# Patient Record
Sex: Female | Born: 1990 | Race: White | Hispanic: No | Marital: Single | Smoking: Current every day smoker
Health system: Southern US, Community
[De-identification: ages and names within clinical notes are randomized; demographics above are authoritative.]

## PROBLEM LIST (undated history)

## (undated) DIAGNOSIS — E119 Type 2 diabetes mellitus without complications: Secondary | ICD-10-CM

## (undated) DIAGNOSIS — R519 Headache, unspecified: Secondary | ICD-10-CM

## (undated) DIAGNOSIS — I10 Essential (primary) hypertension: Secondary | ICD-10-CM

## (undated) DIAGNOSIS — E282 Polycystic ovarian syndrome: Secondary | ICD-10-CM

## (undated) DIAGNOSIS — L309 Dermatitis, unspecified: Secondary | ICD-10-CM

## (undated) DIAGNOSIS — N6310 Unspecified lump in the right breast, unspecified quadrant: Secondary | ICD-10-CM

## (undated) HISTORY — DX: Unspecified lump in the right breast, unspecified quadrant: N63.10

## (undated) HISTORY — DX: Dermatitis, unspecified: L30.9

## (undated) HISTORY — PX: CHOLECYSTECTOMY: SHX55

---
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
## 2009-10-07 ENCOUNTER — Inpatient Hospital Stay: Payer: Self-pay | Admitting: Obstetrics and Gynecology

## 2010-01-06 ENCOUNTER — Emergency Department: Payer: Self-pay | Admitting: Emergency Medicine

## 2010-10-21 ENCOUNTER — Emergency Department: Payer: Self-pay | Admitting: Emergency Medicine

## 2012-06-13 DIAGNOSIS — N6310 Unspecified lump in the right breast, unspecified quadrant: Secondary | ICD-10-CM

## 2012-06-13 HISTORY — DX: Unspecified lump in the right breast, unspecified quadrant: N63.10

## 2012-06-13 HISTORY — PX: BREAST SURGERY: SHX581

## 2013-03-07 ENCOUNTER — Other Ambulatory Visit: Payer: Managed Care, Other (non HMO)

## 2013-03-07 ENCOUNTER — Ambulatory Visit (INDEPENDENT_AMBULATORY_CARE_PROVIDER_SITE_OTHER): Payer: Managed Care, Other (non HMO) | Admitting: General Surgery

## 2013-03-07 ENCOUNTER — Encounter: Payer: Self-pay | Admitting: General Surgery

## 2013-03-07 VITALS — BP 100/64 | HR 84 | Resp 14 | Ht 65.0 in | Wt 170.0 lb

## 2013-03-07 DIAGNOSIS — N63 Unspecified lump in unspecified breast: Secondary | ICD-10-CM

## 2013-03-07 NOTE — Progress Notes (Signed)
Patient ID: Kim Crawford, female   DOB: March 23, 1991, 22 y.o.   MRN: [id number]  Chief Complaint  Patient presents with  . Breast Problem    HPI Kim Crawford is a 22 y.o. female.  who presents for a breast evaluation referred by Kim Ferrari NP. There is a right breast mass noted about one month ago.  States the area is tender.  Denies nipple discharge.  Denies breast trauma. Has never noticed any symptoms like this in the past.        Patient does perform regular self breast checks.    HPI  Past Medical History  Diagnosis Date  . Eczema     Past Surgical History  Procedure Laterality Date  . Cesarean section  2011    Family History  Problem Relation Age of Onset  . Adopted: Yes    Social History History  Substance Use Topics  . Smoking status: Current Every Day Smoker -- 1.00 packs/day for 7 years    Types: Cigarettes  . Smokeless tobacco: Not on file  . Alcohol Use: Yes     Comment: occasionally    Allergies  Allergen Reactions  . Penicillins Rash    No current outpatient prescriptions on file.   No current facility-administered medications for this visit.    Review of Systems Review of Systems  Constitutional: Negative.   Respiratory: Negative.   Cardiovascular: Negative.     Blood pressure 100/64, pulse 84, resp. rate 14, height 5' 5"  (1.651 m), weight 170 lb (77.111 kg).  Physical Exam Physical Exam  Constitutional: She is oriented to person, place, and time. She appears well-developed and well-nourished.  Eyes: Conjunctivae are normal. No scleral icterus.  Neck: Neck supple. No thyromegaly present.  Cardiovascular: Normal rate, regular rhythm and normal heart sounds.   Pulmonary/Chest: Effort normal and breath sounds normal. Right breast exhibits mass ( 2cm firm, mobile mass located 10 o'cl right ) and tenderness. Right breast exhibits no inverted nipple, no nipple discharge and no skin change. Left breast exhibits no inverted nipple, no mass, no  nipple discharge, no skin change and no tenderness.  Lymphadenopathy:    She has no cervical adenopathy.    She has no axillary adenopathy.  Neurological: She is alert and oriented to person, place, and time.  Skin: Skin is warm and dry.    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        Kim Crawford G 03/08/2013, 5:30 AM

## 2013-03-07 NOTE — Patient Instructions (Addendum)
Continue self breast exams. Call office for any new breast issues or concerns.Breast Biopsy Care After These instructions give you information on caring for yourself after your procedure. Your doctor may also give you more specific instructions. Call your doctor if you have any problems or questions after your procedure. HOME CARE  Only take medicine as told by your doctor.  Do not take aspirin.  Keep your sutures (stitches) dry when bathing.  Protect the biopsy area. Do not let the area get bumped.  Avoid activities that could pull the biopsy site open until your doctor approves. This includes:  Stretching.  Reaching.  Exercise.  Sports.  Lifting more than 3lb.  Continue your normal diet.  Wear a good support bra for as long as told by your doctor.  Change any bandages (dressings) as told by your doctor.  Do not drink alcohol while taking pain medicine.  Keep all doctor visits as told. Ask when your test results will be ready. Make sure you get your test results. GET HELP RIGHT AWAY IF:   You have a fever.  You have more bleeding (more than a small spot) from the biopsy site.  You have trouble breathing.  You have yellowish-white fluid (pus) coming from the biopsy site.  You have redness, puffiness (swelling), or more pain in the biopsy site.  You have a bad smell coming from the biopsy site.  Your biopsy site opens after sutures, staples, or sticky strips have been removed.  You have a rash.  You need stronger medicine. MAKE SURE YOU:  Understand these instructions.  Will watch your condition.  Will get help right away if you are not doing well or get worse. Document Released: 03/26/2009 Document Revised: 08/22/2011 Document Reviewed: 07/10/2011 [facility] Patient Information 2014 [place].

## 2013-03-08 ENCOUNTER — Encounter: Payer: Self-pay | Admitting: General Surgery

## 2013-03-11 ENCOUNTER — Telehealth: Payer: Self-pay | Admitting: General Surgery

## 2013-03-11 LAB — PATHOLOGY

## 2013-03-11 NOTE — Telephone Encounter (Signed)
Path report is consistent with fibroadenoma. No atypia noted. Pt advised on this. She is inclined to have it removed since it does seem to be causing some local discomfort. She states she will think about this and let us know her decision.  If she decides against excision will like to see her in 3-4 mos.

## 2013-03-11 NOTE — Telephone Encounter (Signed)
PATIENT CALLED & WOULD LIKE TO KNOW HER PATH RESULTS FROM 03-07-13.

## 2013-03-12 NOTE — Telephone Encounter (Signed)
The patient has decided to wait and monitor the area for now.  If it changes she will call back sooner. F/U in Dec as scheduled.

## 2013-03-20 ENCOUNTER — Telehealth: Payer: Self-pay | Admitting: General Surgery

## 2013-03-20 NOTE — Telephone Encounter (Signed)
PATIENT CALLED AND IS READY TO SET UP EXCISION.PLEASE INDICATE WHAT EXCISION & TO BE DONE IN [facility] HOSPITAL.

## 2013-03-25 ENCOUNTER — Telehealth: Payer: Self-pay | Admitting: *Deleted

## 2013-03-25 NOTE — Telephone Encounter (Signed)
Patient called the office and states she starts a new job tomorrow. She will come in for a pre-op visit on 03-27-13 at 3:15 pm. This patient will talk to her employer tomorrow and let us know what day next week would be best for surgery.

## 2013-03-25 NOTE — Telephone Encounter (Signed)
Message for patient to call the office.  We need to arrange for her right breast mass excision. This is to be completed at [facility] as an outpatient setting. She will need to come in for a pre-op visit a few days prior to surgery.

## 2013-03-25 NOTE — Telephone Encounter (Signed)
Patient called back to say she would like to have surgery around Thanksgiving. This has been arranged for 05-07-13 at [facility]. Paperwork will be mailed to the patient. She will come in the office for a pre-op visit on 05-02-13 instead of this month as originally planned.

## 2013-03-27 ENCOUNTER — Ambulatory Visit: Payer: Managed Care, Other (non HMO) | Admitting: General Surgery

## 2013-04-17 ENCOUNTER — Other Ambulatory Visit: Payer: Self-pay | Admitting: General Surgery

## 2013-04-17 DIAGNOSIS — D241 Benign neoplasm of right breast: Secondary | ICD-10-CM

## 2013-05-02 ENCOUNTER — Ambulatory Visit (INDEPENDENT_AMBULATORY_CARE_PROVIDER_SITE_OTHER): Payer: Managed Care, Other (non HMO) | Admitting: General Surgery

## 2013-05-02 ENCOUNTER — Encounter: Payer: Self-pay | Admitting: General Surgery

## 2013-05-02 VITALS — BP 140/90 | HR 84 | Resp 14 | Ht 65.0 in | Wt 173.0 lb

## 2013-05-02 DIAGNOSIS — D249 Benign neoplasm of unspecified breast: Secondary | ICD-10-CM

## 2013-05-02 DIAGNOSIS — D241 Benign neoplasm of right breast: Secondary | ICD-10-CM

## 2013-05-02 NOTE — Progress Notes (Signed)
Patient presents for a pre op evaluation for a right breast mass excision. The surgery is scheduled for 05/07/13. The patient denies any new problems at this time.    She has a fibroadenoma in right breast. It has been causing some focal pain which has not resolved. Pt opted to have it removed.  The patient otherwise healthy with no major medical illnesses.  Examination: Neck supple no nodes or masses palpable. Lungs clear to auscultation percussion. Heart sinus rhythm no murmurs appreciated. Breast exam shows a palpable firm mobile mass about 2-2-1/2 cm in the upper  outer portion of right breast. No other palpable abnormality on either side. No lymphadenopathy noted in the neck or the axilla.  Impression: Fibroadenoma right breast symptomatic. Plan: Excision. Procedure risks benefits explained to the patient. She is agreeable.

## 2013-05-02 NOTE — Patient Instructions (Signed)
Patient is scheduled for right breast surgery on 05/07/13.

## 2013-05-03 ENCOUNTER — Encounter: Payer: Self-pay | Admitting: General Surgery

## 2013-05-07 ENCOUNTER — Ambulatory Visit: Payer: Self-pay | Admitting: General Surgery

## 2013-05-07 ENCOUNTER — Encounter: Payer: Self-pay | Admitting: General Surgery

## 2013-05-07 DIAGNOSIS — D249 Benign neoplasm of unspecified breast: Secondary | ICD-10-CM

## 2013-05-08 LAB — PATHOLOGY REPORT

## 2013-05-13 ENCOUNTER — Encounter: Payer: Self-pay | Admitting: General Surgery

## 2013-05-16 ENCOUNTER — Ambulatory Visit: Payer: Managed Care, Other (non HMO) | Admitting: General Surgery

## 2013-05-16 ENCOUNTER — Encounter: Payer: Self-pay | Admitting: General Surgery

## 2013-05-16 ENCOUNTER — Ambulatory Visit (INDEPENDENT_AMBULATORY_CARE_PROVIDER_SITE_OTHER): Payer: Managed Care, Other (non HMO) | Admitting: General Surgery

## 2013-05-16 VITALS — BP 116/78 | HR 78 | Resp 16 | Ht 65.0 in | Wt 172.0 lb

## 2013-05-16 DIAGNOSIS — D241 Benign neoplasm of right breast: Secondary | ICD-10-CM

## 2013-05-16 DIAGNOSIS — D249 Benign neoplasm of unspecified breast: Secondary | ICD-10-CM

## 2013-05-16 NOTE — Patient Instructions (Signed)
The patient is advised to continue self breast checks monthly. Patient to return as needed.

## 2013-05-16 NOTE — Progress Notes (Signed)
Patient presents for a 1-2 week post op right breast mass excision. The patient is doing well at this time. She has a little bit of pain at night and first thing in the morning. She only uses the Tramadol as needed.   The incision site right breast is healing well. No signs of infection or hematoma. Path-Benign fibroadenoma. Pt advised to do monthly self breast exam.

## 2014-04-14 ENCOUNTER — Encounter: Payer: Self-pay | Admitting: General Surgery

## 2014-10-03 NOTE — Op Note (Signed)
PATIENT NAME:  Kim Crawford, Kim Crawford MR#:  [id number] DATE OF BIRTH:  Feb 26, 1991  DATE OF PROCEDURE:  05/07/2013  PREOPERATIVE DIAGNOSIS: Fibroadenoma right breast.   POSTOPERATIVE DIAGNOSIS: Fibroadenoma right breast.   OPERATION PERFORMED: Excision of right breast fibroadenoma.   SURGEON: Mckinley Jewel, M.D.   ANESTHESIA: General.   COMPLICATIONS: None.   ESTIMATED BLOOD LOSS: Minimal.   DRAINS: None.   PROCEDURE: The patient was brought to the Operating Room and placed in the supine position on the operating table and was put to sleep with an LMA. The right breast was prepped and draped out as a sterile field. Timeout was performed. The patient had a palpable 2 to 2.5 cm mass in the 9 to 10 o'clock position in the mid outer right breast. This was core biopsied showing this to be a fibroadenoma. The patient opted to have removed because of some focal pain associated with it. 10 mL after Marcaine was instilled and a curvilinear incision was made overlying the mass and carefully deepened through to expose the lobulated 2.5 cm long mass which was then circumferentially freed and a small portion of breast tissue attached to it was removed along with it. Bleeding was controlled with cautery. The excised tissue was sent in formalin for pathology. To ensure hemostasis the glandular tissue was closed with 2-0 Vicryl as also the subcutaneous tissue. The skin was closed with subcuticular 4-0 Vicryl. Dermabond was applied. Procedure was well tolerated. She was returned to the recovery room in stable condition.    ____________________________ S.Robinette Haines, MD sgs:sg D: 05/07/2013 08:10:45 ET T: 05/07/2013 09:34:03 ET JOB#: [id number]  cc: Synthia Innocent. Jamal Collin, MD, <Dictator> [facility] Robinette Haines MD ELECTRONICALLY SIGNED 05/07/2013 16:12

## 2016-05-24 ENCOUNTER — Emergency Department: Payer: Self-pay

## 2016-05-24 ENCOUNTER — Encounter: Payer: Self-pay | Admitting: Emergency Medicine

## 2016-05-24 ENCOUNTER — Emergency Department
Admission: EM | Admit: 2016-05-24 | Discharge: 2016-05-24 | Disposition: A | Discharge status: Home / Self Care | Payer: Self-pay | Attending: Emergency Medicine | Admitting: Emergency Medicine

## 2016-05-24 DIAGNOSIS — M533 Sacrococcygeal disorders, not elsewhere classified: Secondary | ICD-10-CM | POA: Insufficient documentation

## 2016-05-24 DIAGNOSIS — F17210 Nicotine dependence, cigarettes, uncomplicated: Secondary | ICD-10-CM | POA: Insufficient documentation

## 2016-05-24 DIAGNOSIS — L0501 Pilonidal cyst with abscess: Secondary | ICD-10-CM | POA: Insufficient documentation

## 2016-05-24 LAB — BASIC METABOLIC PANEL
Anion gap: 7 (ref 5–15)
BUN: 14 mg/dL (ref 6–20)
CHLORIDE: 105 mmol/L (ref 101–111)
CO2: 26 mmol/L (ref 22–32)
Calcium: 9 mg/dL (ref 8.9–10.3)
Creatinine, Ser: 0.65 mg/dL (ref 0.44–1.00)
GFR calc Af Amer: >60 mL/min (ref >60)
GFR calc non Af Amer: >60 mL/min (ref >60)
Glucose, Bld: 111 mg/dL — ABNORMAL HIGH (ref 65–99)
POTASSIUM: 3.7 mmol/L (ref 3.5–5.1)
SODIUM: 138 mmol/L (ref 135–145)

## 2016-05-24 LAB — CBC WITH DIFFERENTIAL/PLATELET
Basophils Absolute: 0.1 10*3/uL (ref 0–0.1)
Basophils Relative: 0 %
EOS PCT: 2 %
Eosinophils Absolute: 0.2 10*3/uL (ref 0–0.7)
HCT: 43.9 % (ref 35.0–47.0)
HEMOGLOBIN: 15.1 g/dL (ref 12.0–16.0)
LYMPHS ABS: 3.1 10*3/uL (ref 1.0–3.6)
LYMPHS PCT: 24 %
MCH: 30.6 pg (ref 26.0–34.0)
MCHC: 34.5 g/dL (ref 32.0–36.0)
MCV: 88.8 fL (ref 80.0–100.0)
Monocytes Absolute: 0.9 10*3/uL (ref 0.2–0.9)
Monocytes Relative: 7 %
NEUTROS PCT: 67 %
Neutro Abs: 8.5 10*3/uL — ABNORMAL HIGH (ref 1.4–6.5)
Platelets: 165 10*3/uL (ref 150–440)
RBC: 4.94 MIL/uL (ref 3.80–5.20)
RDW: 12.7 % (ref 11.5–14.5)
WBC: 12.8 10*3/uL — AB (ref 3.6–11.0)

## 2016-05-24 LAB — PREGNANCY, URINE: Preg Test, Ur: NEGATIVE

## 2016-05-24 MED ORDER — CLINDAMYCIN PHOSPHATE 600 MG/50ML IV SOLN
600.0000 mg | Freq: Once | INTRAVENOUS | Status: AC
  Administered 2016-05-24: 600 mg via INTRAVENOUS
  Filled 2016-05-24: qty 50

## 2016-05-24 MED ORDER — CLINDAMYCIN HCL 300 MG PO CAPS: 300.0000 mg | ORAL_CAPSULE | Freq: Three times a day (TID) | ORAL | 0 refills | Status: DC

## 2016-05-24 MED ORDER — NAPROXEN 500 MG PO TABS: 500.0000 mg | ORAL_TABLET | Freq: Two times a day (BID) | ORAL | 0 refills | Status: DC

## 2016-05-24 MED ORDER — IOPAMIDOL (ISOVUE-300) INJECTION 61%
30.0000 mL | Freq: Once | INTRAVENOUS | Status: AC | PRN
  Administered 2016-05-24: 30 mL via ORAL
  Filled 2016-05-24: qty 30

## 2016-05-24 MED ORDER — HYDROCODONE-ACETAMINOPHEN 5-325 MG PO TABS: 1.0000 | ORAL_TABLET | Freq: Four times a day (QID) | ORAL | 0 refills | Status: DC | PRN

## 2016-05-24 MED ORDER — IOPAMIDOL (ISOVUE-300) INJECTION 61%
100.0000 mL | Freq: Once | INTRAVENOUS | Status: AC | PRN
  Administered 2016-05-24: 100 mL via INTRAVENOUS
  Filled 2016-05-24: qty 100

## 2016-05-24 MED ORDER — KETOROLAC TROMETHAMINE 30 MG/ML IJ SOLN
30.0000 mg | Freq: Once | INTRAMUSCULAR | Status: AC
  Administered 2016-05-24: 30 mg via INTRAVENOUS
  Filled 2016-05-24: qty 1

## 2016-05-24 NOTE — ED Provider Notes (Signed)
[facility] Emergency Department Provider Note  ____________________________________________  Time seen: Approximately 10:10 AM  I have reviewed the triage vital signs and the nursing notes.   HISTORY  Chief Complaint Back Pain    HPI Kim Crawford is a 25 y.o. female , NAD, presents to the department with four-day history of tailbone pain. Patient states that in the remote past she fractured her coccyx. Slipped and fell on her bottom in the shower approximately one week ago. Noted pain and swelling about her upper buttock about 4 days ago. Pain significantly increased this morning and bloody drainage was noted from the upper portion of her gluteal cleft. Denies any fevers or body aches. Has had chills. Has not had any chest pain, shortness breath, abdominal pain, nausea or vomiting. Has had no saddle paresthesias or loss of bowel or bladder control. Denies any changes in urinary or bowel habits. No history of abscesses, boils or pilonidal cysts.   Past Medical History:  Diagnosis Date  . Breast mass, right 2014  . Eczema     There are no active problems to display for this patient.   Past Surgical History:  Procedure Laterality Date  . BREAST SURGERY Right 2014   breast mass  . CESAREAN SECTION  2011    Prior to Admission medications   Medication Sig Start Date End Date Taking? Authorizing Provider  clindamycin (CLEOCIN) 300 MG capsule Take 1 capsule (300 mg total) by mouth 3 (three) times daily. 05/24/16   Sady Monaco L Nathaniel Wakeley, PA-C  HYDROcodone-acetaminophen (NORCO) 5-325 MG tablet Take 1 tablet by mouth every 6 (six) hours as needed for severe pain. 05/24/16   Amiee Wiley L Terren Haberle, PA-C  naproxen (NAPROSYN) 500 MG tablet Take 1 tablet (500 mg total) by mouth 2 (two) times daily with a meal. 05/24/16   Tenea Sens L Mikenna Bunkley, PA-C  traMADol (ULTRAM) 50 MG tablet Take 1 tablet by mouth as needed. 05/08/13   Historical Provider, MD    Allergies Penicillins  Family  History  Problem Relation Age of Onset  . Adopted: Yes    Social History Social History  Substance Use Topics  . Smoking status: Current Every Day Smoker    Packs/day: 1.00    Years: 7.00    Types: Cigarettes  . Smokeless tobacco: Never Used  . Alcohol use Yes     Comment: occasionally     Review of Systems  Constitutional: Positive chills. No fever, fatigue. Cardiovascular: No chest pain. Respiratory: No shortness of breath.  Gastrointestinal: No abdominal pain.  No nausea, vomiting.  No diarrhea.  No constipation. Genitourinary: Negative for dysuria. No hematuria. No urinary hesitancy, urgency or increased frequency. Musculoskeletal: Positive for lower back pain. No extremity pain..  Skin: Positive for oozing buttock. Negative for rash, redness, abnormal warmth. Neurological: Negative for saddle paresthesias or loss of bowel or bladder control. No numbness, wheezes, tingling. 10-point ROS otherwise negative.  ____________________________________________   PHYSICAL EXAM:  VITAL SIGNS: ED Triage Vitals  Enc Vitals Group     BP 05/24/16 0905 128/82     Pulse Rate 05/24/16 0905 90     Resp 05/24/16 0905 20     Temp 05/24/16 0905 98 F (36.7 C)     Temp Source 05/24/16 0905 Oral     SpO2 05/24/16 0905 100 %     Weight 05/24/16 0906 160 lb (72.6 kg)     Height 05/24/16 0906 5' 5"  (1.651 m)     Head Circumference --  Peak Flow --      Pain Score 05/24/16 0905 10     Pain Loc --      Pain Edu? --      Excl. in [place]? --      Constitutional: Alert and oriented. Well appearing and in no acute distress. Eyes: Conjunctivae are normal.  Head: Atraumatic. Cardiovascular: Good peripheral circulation. Respiratory: Normal respiratory effort without tachypnea or retractions.  Musculoskeletal: Tenderness to palpation about the sacral and lower lumbar spinal area without step-off or deformity. Full range of motion of bilateral upper and lower extremity. Pain or  difficulty. Neurologic:  Normal speech and language. No gross focal neurologic deficits are appreciated. Sensation to light touch about the lower extremities is grossly normal. Skin:  Serosanguineous and purulent fluid drainage is noted about the upper gluteal cleft with tenderness to palpation about the region. No induration or evidence of cellulitis is noted. No fluctuance. Skin is warm, dry. No rash noted. Psychiatric: Mood and affect are normal. Speech and behavior are normal. Patient exhibits appropriate insight and judgement.   ____________________________________________   LABS (all labs ordered are listed, but only abnormal results are displayed)  Labs Reviewed  BASIC METABOLIC PANEL - Abnormal; Notable for the following:       Result Value   Glucose, Bld 111 (*)    All other components within normal limits  CBC WITH DIFFERENTIAL/PLATELET - Abnormal; Notable for the following:    WBC 12.8 (*)    Neutro Abs 8.5 (*)    All other components within normal limits  PREGNANCY, URINE   ____________________________________________  EKG  None ____________________________________________  RADIOLOGY I, Judithe Modest Favian Kittleson, personally viewed and evaluated these images (plain radiographs) as part of my medical decision making, as well as reviewing the written report by the radiologist.  Ct Pelvis W Contrast  Result Date: 05/24/2016 CLINICAL DATA:  History of broken tailbone with 4 days of buttocks pain. Fell in shower 1 week ago. Possible abscess. EXAM: CT PELVIS WITH CONTRAST TECHNIQUE: Multidetector CT imaging of the pelvis was performed using the standard protocol following the bolus administration of intravenous contrast. CONTRAST:  13m ISOVUE-300 IOPAMIDOL (ISOVUE-300) INJECTION 61% COMPARISON:  None. FINDINGS: Urinary Tract: Normal appearance of the urinary bladder. No significant dilatation of the ureters. Bowel: Visualized appendix appears normal. No evidence for bowel distension or  bowel inflammation. Vascular/Lymphatic: Normal appearance of the vascular structures. There is no significant lymph node enlargement in the pelvis. Reproductive: Normal appearance of the uterus and adnexal structures. Other: Negative for free fluid in the pelvis. Subcutaneous edema in the posterior soft tissues near the coccyx. There is a subtle area of low density just posterior to the coccyx on sequence 2, image 28. This area roughly measures 1.7 x 1.7 cm. Findings are concerning for focal area of inflammation with phlegmon or developing small abscess. Musculoskeletal: Both hips are located.  No acute pelvic fracture. IMPRESSION: Small focus of soft tissue inflammation just posterior to the coccyx. This area roughly measures 1.7 x 1.7 cm. Differential diagnosis in this area includes phlegmon/developing abscess versus hematoma. No acute bone abnormality in the pelvis. Electronically Signed   By: AMarkus DaftM.D.   On: 05/24/2016 12:12    ____________________________________________    PROCEDURES  Procedure(s) performed: None   Procedures   Medications  clindamycin (CLEOCIN) IVPB 600 mg (0 mg Intravenous Stopped 05/24/16 1231)  iopamidol (ISOVUE-300) 61 % injection 30 mL (30 mLs Oral Contrast Given 05/24/16 1042)  ketorolac (TORADOL) 30 MG/ML injection  30 mg (30 mg Intravenous Given 05/24/16 1143)  iopamidol (ISOVUE-300) 61 % injection 100 mL (100 mLs Intravenous Contrast Given 05/24/16 1152)     ____________________________________________   INITIAL IMPRESSION / ASSESSMENT AND PLAN / ED COURSE  Pertinent labs & imaging results that were available during my care of the patient were reviewed by me and considered in my medical decision making (see chart for details).  Clinical Course as of May 25 1323  Tue May 24, 2016  1226 All labs and imaging results were discussed with patient. She states her pain has decreased some since being given Toradol. Will discharge her home with antibiotics,  pain medication and referral to general surgery for follow-up later in the week.  [JH]    Clinical Course User Index [JH] Masayo Fera L Jhoana Upham, PA-C    Patient's diagnosis is consistent with Pilonidal abscess. Patient tolerated IV clindamycin while in the emergency department well without side effects. No significant area of abscess was noted on CT to warrant surgical drainage at this time. Patient will be discharged home with prescriptions for oral clindamycin, and Norco and Naprosyn to take as directed. Patient is to follow up with Dr. Heath Lark in general surgery for further evaluation and treatment. Patient is given ED precautions to return to the ED for any worsening or new symptoms.    ____________________________________________  FINAL CLINICAL IMPRESSION(S) / ED DIAGNOSES  Final diagnoses:  Pilonidal abscess      NEW MEDICATIONS STARTED DURING THIS VISIT:  Discharge Medication List as of 05/24/2016 12:29 PM    START taking these medications   Details  clindamycin (CLEOCIN) 300 MG capsule Take 1 capsule (300 mg total) by mouth 3 (three) times daily., Starting Tue 05/24/2016, Print    HYDROcodone-acetaminophen (NORCO) 5-325 MG tablet Take 1 tablet by mouth every 6 (six) hours as needed for severe pain., Starting Tue 05/24/2016, Print    naproxen (NAPROSYN) 500 MG tablet Take 1 tablet (500 mg total) by mouth 2 (two) times daily with a meal., Starting Tue 05/24/2016, [place], PA-C 05/24/16 Roper Quigley, MD 05/24/16 1511

## 2016-05-24 NOTE — ED Triage Notes (Signed)
Patient presents to the ED with back pain x 3 days and reports of swelling to her left sacral area.  Patient states she has history of coccyx fracture when she was younger.  Patient ambulatory to triage.  Patient reports falling about 1 week ago and states she didn't have pain after the fall but pain has developed.  Patient is in no obvious distress at this time.

## 2016-05-24 NOTE — ED Notes (Signed)
Patient transported to CT 

## 2016-05-24 NOTE — Discharge Instructions (Signed)
Please call Dr. Geoffry Paradise office today to schedule follow up

## 2016-05-24 NOTE — ED Notes (Signed)
Pt reports she slipped and fell in the shower 1 week ago, injured tail bone, has had previous fracture to this area.

## 2016-05-24 NOTE — ED Notes (Signed)
Signature pad not working.  Patient verbalized understanding of discharge paperwork and has no further questions.

## 2016-05-26 ENCOUNTER — Ambulatory Visit (INDEPENDENT_AMBULATORY_CARE_PROVIDER_SITE_OTHER): Payer: BLUE CROSS/BLUE SHIELD | Admitting: Surgery

## 2016-05-26 ENCOUNTER — Encounter: Payer: Self-pay | Admitting: Surgery

## 2016-05-26 VITALS — BP 142/99 | HR 85 | Temp 98.4°F | Ht 65.0 in | Wt 191.0 lb

## 2016-05-26 DIAGNOSIS — L0591 Pilonidal cyst without abscess: Secondary | ICD-10-CM

## 2016-05-26 DIAGNOSIS — L988 Other specified disorders of the skin and subcutaneous tissue: Secondary | ICD-10-CM

## 2016-05-26 NOTE — Patient Instructions (Addendum)
Please remember to STOP SMOKING!  Please shave your buttocks all the time.  Please clean your buttocks with wipes after every bowel movement.  Take Ibuprofen or Tylenol to help with the pain.

## 2016-05-27 NOTE — Progress Notes (Addendum)
Patient ID: Kim Crawford, female   DOB: 1991/04/05, 25 y.o.   MRN: [id number]  HPI Kim Crawford is a 25 y.o. female seen at the request of Ms. Hagler for a pilonidal cyst. She recently went to the emergency room 2 days ago and for the same reason she reports that over the last week or so she has been having an moderate to severe buttocks pain. The pain is intermittent, sharp in nature, worsening when sitting down. Of note she reports that when she was a child and while she was playing is no she hit her coccyx is bone and apparently suffers from injury. Finding no major complication from this injury. Further workup in the emergency room a CT scan was performed and a half personally reviewed and there is some slight inflammation procedure to fax his but this is minimal with no evidence of drainable abscess or collections. No evidence of necrotizing infection  HPI  Past Medical History:  Diagnosis Date  . Breast mass, right 2014  . Eczema     Past Surgical History:  Procedure Laterality Date  . BREAST SURGERY Right 2014   breast mass  . CESAREAN SECTION  2011    Family History  Problem Relation Age of Onset  . Adopted: Yes    Social History Social History  Substance Use Topics  . Smoking status: Current Every Day Smoker    Packs/day: 1.00    Years: 7.00    Types: Cigarettes  . Smokeless tobacco: Never Used  . Alcohol use Yes     Comment: occasionally    Allergies  Allergen Reactions  . Penicillins Rash    Current Outpatient Prescriptions  Medication Sig Dispense Refill  . clindamycin (CLEOCIN) 300 MG capsule Take 1 capsule (300 mg total) by mouth 3 (three) times daily. 30 capsule 0  . HYDROcodone-acetaminophen (NORCO) 5-325 MG tablet Take 1 tablet by mouth every 6 (six) hours as needed for severe pain. 6 tablet 0  . naproxen (NAPROSYN) 500 MG tablet Take 1 tablet (500 mg total) by mouth 2 (two) times daily with a meal. 14 tablet 0   No current  facility-administered medications for this visit.      Review of Systems A 10 point review of systems was asked and was negative except for the information on the HPI  Physical Exam Blood pressure (!) 142/99, pulse 85, temperature 98.4 F (36.9 C), temperature source Oral, height 5' 5"  (1.651 m), weight 86.6 kg (191 lb). CONSTITUTIONAL: NAD EYES: Pupils are equal, round, and reactive to light,  Sclera are non-icteric. LYMPH NODES:  Lymph nodes in the neck are normal. RESPIRATORY:There is normal respiratory effort, without pathologic use of accessory muscles. CARDIOVASCULAR: Regular pulse, good distal perfusion. GI: The abdomen is soft, nontender, and nondistended. There are no palpable masses. There is no hepatosplenomegaly. There are normal bowel sounds in all quadrants. Rectal: There is a small patent posterior to the anus in the sacral area. And it's not draining the opening is about 3 mm in size there is no evidence of undrained pockets. There is no evidence of necrotizing infection or abscess. No evidence of any anal or rectal masses. MUSCULOSKELETAL: Normal muscle strength and tone. No cyanosis or edema.   SKIN: Turgor is good and there are no pathologic skin lesions or ulcers. NEUROLOGIC: Motor and sensation is grossly normal. Cranial nerves are grossly intact. PSYCH:  Oriented to person, place and time. Affect is normal.  Data Reviewed I have personally reviewed the  patient's imaging, laboratory findings and medical records.    Assessment/Plan Pilonidal cyst and with resolving infection. No need for any surgical revision at this time. The discussed with the patient in detail about management with shaving the area and keeping good hygiene. And given that this is an uncomplicated event and this is the first one I would not recommend any surgical intervention. If this will recur we'll revisit this topic and she may need an excision of the cyst. Discussed with the patient in detail  about his situation and extensive counseling provided. Counseled her about smoking cessation and side effects on wound healing and pilonidal disease.  Caroleen Hamman, MD FACS General Surgeon 05/27/2016, 10:35 AM

## 2016-10-27 ENCOUNTER — Emergency Department
Admission: EM | Admit: 2016-10-27 | Discharge: 2016-10-27 | Disposition: A | Discharge status: Home / Self Care | Payer: Self-pay | Attending: Internal Medicine | Admitting: Internal Medicine

## 2016-10-27 ENCOUNTER — Encounter: Payer: Self-pay | Admitting: Emergency Medicine

## 2016-10-27 DIAGNOSIS — K0889 Other specified disorders of teeth and supporting structures: Secondary | ICD-10-CM | POA: Insufficient documentation

## 2016-10-27 DIAGNOSIS — F17210 Nicotine dependence, cigarettes, uncomplicated: Secondary | ICD-10-CM | POA: Insufficient documentation

## 2016-10-27 DIAGNOSIS — Z79899 Other long term (current) drug therapy: Secondary | ICD-10-CM | POA: Insufficient documentation

## 2016-10-27 DIAGNOSIS — R6884 Jaw pain: Secondary | ICD-10-CM | POA: Insufficient documentation

## 2016-10-27 MED ORDER — LIDOCAINE-EPINEPHRINE 2 %-1:100000 IJ SOLN
1.7000 mL | Freq: Once | INTRAMUSCULAR | Status: DC
  Filled 2016-10-27: qty 1.7

## 2016-10-27 MED ORDER — TRAMADOL HCL 50 MG PO TABS: 50.0000 mg | ORAL_TABLET | Freq: Two times a day (BID) | ORAL | 0 refills | Status: DC

## 2016-10-27 NOTE — ED Provider Notes (Signed)
[facility] Emergency Department Provider Note ____________________________________________  Time seen: 1830  I have reviewed the triage vital signs and the nursing notes.  HISTORY  Chief Complaint  Jaw Pain  HPI Kim Crawford is a 26 y.o. female presents to the ED for right-sided jaw pain, despite seeing a dental provider yesterday. The patient is reportedly to have to molars pulled on the left lower jaw, and was evaluated by her dental provider and placed on clindamycin. She reports today she noted pain to the right upper jaw that radiates from the anterior ear along the upper jawline. She denies any fluctuance, pointing, or spontaneous drainage. She also denies any interim fevers, chills, or sweats.  Past Medical History:  Diagnosis Date  . Breast mass, right 2014  . Eczema     There are no active problems to display for this patient.   Past Surgical History:  Procedure Laterality Date  . BREAST SURGERY Right 2014   breast mass  . CESAREAN SECTION  2011    Prior to Admission medications   Medication Sig Start Date End Date Taking? Authorizing Provider  clindamycin (CLEOCIN) 300 MG capsule Take 1 capsule (300 mg total) by mouth 3 (three) times daily. 05/24/16   Hagler, Jami L, PA-C  HYDROcodone-acetaminophen (NORCO) 5-325 MG tablet Take 1 tablet by mouth every 6 (six) hours as needed for severe pain. 05/24/16   Hagler, Jami L, PA-C  naproxen (NAPROSYN) 500 MG tablet Take 1 tablet (500 mg total) by mouth 2 (two) times daily with a meal. 05/24/16   Hagler, Jami L, PA-C  traMADol (ULTRAM) 50 MG tablet Take 1 tablet (50 mg total) by mouth 2 (two) times daily. 10/27/16   Ala Kratz, Dannielle Karvonen, PA-C    Allergies Penicillins  Family History  Problem Relation Age of Onset  . Adopted: Yes    Social History Social History  Substance Use Topics  . Smoking status: Current Every Day Smoker    Packs/day: 0.50    Years: 7.00    Types: Cigarettes  .  Smokeless tobacco: Never Used  . Alcohol use Yes     Comment: occasionally    Review of Systems  Constitutional: Negative for fever. Eyes: Negative for visual changes. ENT: Negative for sore throat. Right upper jaw pain as above. Cardiovascular: Negative for chest pain. Respiratory: Negative for shortness of breath. Neurological: Negative for headaches, focal weakness or numbness. ____________________________________________  PHYSICAL EXAM:  VITAL SIGNS: ED Triage Vitals  Enc Vitals Group     BP 10/27/16 1622 (!) 142/86     Pulse Rate 10/27/16 1622 90     Resp 10/27/16 1622 16     Temp 10/27/16 1622 98.3 F (36.8 C)     Temp src --      SpO2 10/27/16 1622 98 %     Weight 10/27/16 1623 160 lb (72.6 kg)     Height 10/27/16 1623 5' 5"  (1.651 m)     Head Circumference --      Peak Flow --      Pain Score 10/27/16 1622 10     Pain Loc --      Pain Edu? --      Excl. in [place]? --     Constitutional: Alert and oriented. Well appearing and in no distress. Head: Normocephalic and atraumatic. Eyes: Conjunctivae are normal. Normal extraocular movements Ears: Canals clear. TMs intact bilaterally. Mouth/Throat: Mucous membranes are moist. Uvula is midline and tonsils are flat. Patient with no  pointing abscess or fluctuance along the upper or lower right jaw. No brawny erythema to the sublingual region. Neck: Supple. No thyromegaly. Hematological/Lymphatic/Immunological: No cervical lymphadenopathy. Cardiovascular: Normal rate, regular rhythm. Normal distal pulses. Respiratory: Normal respiratory effort. No wheezes/rales/rhonchi. ____________________________________________  DENTAL BLOCK  Performed by: Melvenia Needles Consent: Verbal consent obtained. Required items: devices and special equipment available Time out: Immediately prior to procedure a "time out" was called to verify the correct patient, procedure, equipment, support staff and site/side marked as  required.  Indication: pain Nerve block body site: right upper 1st & 3rd molars  Preparation: Patient was prepped and draped in the usual sterile fashion. Needle gauge: 31 G Location technique: anatomical landmarks  Local anesthetic: lido-epi 2%-1:100000  Anesthetic total: 1.5 ml  Outcome: pain improved Patient tolerance: Patient tolerated the procedure well with no immediate complications. ____________________________________________  INITIAL IMPRESSION / ASSESSMENT AND PLAN / ED COURSE  Patient with jaw pain and dental pain to the right upper jaw without signs of any focal dental abscess. She will complete the clindamycin previously prescribed by her dental provider. She is discharged with a prescription for Ultram No. 10 to dose as needed for breakthrough pain. And she is further referred to one of the dental clinics for definitive treatment. ____________________________________________  FINAL CLINICAL IMPRESSION(S) / ED DIAGNOSES  Final diagnoses:  Jaw pain, non-TMJ  Pain, dental      Carmie End, Dannielle Karvonen, PA-C 10/29/16 0017    Earleen Newport, MD 10/31/16 [phone]

## 2016-10-27 NOTE — ED Triage Notes (Signed)
Pt states saw a dentist yesterday for eval - is to have 2 teeth pulled and was placed on clinda. Jaw pain today.

## 2016-10-27 NOTE — Discharge Instructions (Signed)
Your exam does not reveal any acute infection. Take the prescription antibiotic previously prescribed. Follow-up with your dental provider or [facility] as planned. Take the pain medicine as needed.

## 2016-10-27 NOTE — ED Notes (Signed)
See triage note  States went to dentist yesterday for eval for 2 bad teeth  Now having increased jaw pain currently taking clinda for infection

## 2016-10-27 NOTE — ED Notes (Signed)

## 2018-01-03 ENCOUNTER — Encounter: Payer: Self-pay | Admitting: Emergency Medicine

## 2018-01-03 ENCOUNTER — Other Ambulatory Visit: Payer: Self-pay

## 2018-01-03 ENCOUNTER — Inpatient Hospital Stay
Admission: EM | Admit: 2018-01-03 | Discharge: 2018-01-06 | DRG: 872 | Disposition: A | Discharge status: Home / Self Care | Payer: Medicaid Other | Attending: Specialist | Admitting: Specialist

## 2018-01-03 DIAGNOSIS — L0390 Cellulitis, unspecified: Secondary | ICD-10-CM | POA: Diagnosis present

## 2018-01-03 DIAGNOSIS — F17210 Nicotine dependence, cigarettes, uncomplicated: Secondary | ICD-10-CM | POA: Diagnosis present

## 2018-01-03 DIAGNOSIS — L0591 Pilonidal cyst without abscess: Secondary | ICD-10-CM

## 2018-01-03 DIAGNOSIS — L03317 Cellulitis of buttock: Secondary | ICD-10-CM | POA: Diagnosis present

## 2018-01-03 DIAGNOSIS — Z886 Allergy status to analgesic agent status: Secondary | ICD-10-CM

## 2018-01-03 DIAGNOSIS — Z79899 Other long term (current) drug therapy: Secondary | ICD-10-CM

## 2018-01-03 DIAGNOSIS — Z880 Allergy status to penicillin: Secondary | ICD-10-CM

## 2018-01-03 DIAGNOSIS — A419 Sepsis, unspecified organism: Principal | ICD-10-CM

## 2018-01-03 LAB — CBC WITH DIFFERENTIAL/PLATELET
BASOS PCT: 1 %
Basophils Absolute: 0.1 10*3/uL (ref 0–0.1)
Eosinophils Absolute: 0.2 10*3/uL (ref 0–0.7)
Eosinophils Relative: 1 %
HEMATOCRIT: 40.9 % (ref 35.0–47.0)
HEMOGLOBIN: 14.2 g/dL (ref 12.0–16.0)
LYMPHS ABS: 3.5 10*3/uL (ref 1.0–3.6)
Lymphocytes Relative: 24 %
MCH: 31.1 pg (ref 26.0–34.0)
MCHC: 34.8 g/dL (ref 32.0–36.0)
MCV: 89.5 fL (ref 80.0–100.0)
MONOS PCT: 8 %
Monocytes Absolute: 1.2 10*3/uL — ABNORMAL HIGH (ref 0.2–0.9)
NEUTROS ABS: 9.9 10*3/uL — AB (ref 1.4–6.5)
NEUTROS PCT: 66 %
PLATELETS: 183 10*3/uL (ref 150–440)
RBC: 4.57 MIL/uL (ref 3.80–5.20)
RDW: 13.1 % (ref 11.5–14.5)
WBC: 14.9 10*3/uL — ABNORMAL HIGH (ref 3.6–11.0)

## 2018-01-03 LAB — BASIC METABOLIC PANEL
ANION GAP: 11 (ref 5–15)
BUN: 12 mg/dL (ref 6–20)
CO2: 22 mmol/L (ref 22–32)
Calcium: 9 mg/dL (ref 8.9–10.3)
Chloride: 105 mmol/L (ref 98–111)
Creatinine, Ser: 0.82 mg/dL (ref 0.44–1.00)
GFR calc Af Amer: >60 mL/min (ref >60)
Glucose, Bld: 191 mg/dL — ABNORMAL HIGH (ref 70–99)
POTASSIUM: 3.6 mmol/L (ref 3.5–5.1)
SODIUM: 138 mmol/L (ref 135–145)

## 2018-01-03 LAB — LACTIC ACID, PLASMA: LACTIC ACID, VENOUS: 1.5 mmol/L (ref 0.5–1.9)

## 2018-01-03 LAB — POCT PREGNANCY, URINE: Preg Test, Ur: NEGATIVE

## 2018-01-03 MED ORDER — ACETAMINOPHEN 500 MG PO TABS
1000.0000 mg | ORAL_TABLET | Freq: Once | ORAL | Status: AC
  Administered 2018-01-03: 1000 mg via ORAL
  Filled 2018-01-03: qty 2

## 2018-01-03 MED ORDER — SULFAMETHOXAZOLE-TRIMETHOPRIM 800-160 MG PO TABS
1.0000 | ORAL_TABLET | Freq: Once | ORAL | Status: AC
  Administered 2018-01-03: 1 via ORAL
  Filled 2018-01-03: qty 1

## 2018-01-03 MED ORDER — SODIUM CHLORIDE 0.9 % IV BOLUS
1000.0000 mL | Freq: Once | INTRAVENOUS | Status: AC
  Administered 2018-01-03: 1000 mL via INTRAVENOUS

## 2018-01-03 MED ORDER — KETOROLAC TROMETHAMINE 30 MG/ML IJ SOLN
15.0000 mg | Freq: Once | INTRAMUSCULAR | Status: AC
  Administered 2018-01-03: 15 mg via INTRAVENOUS
  Filled 2018-01-03: qty 1

## 2018-01-03 MED ORDER — SODIUM CHLORIDE 0.9 % IV SOLN
1.0000 g | Freq: Once | INTRAVENOUS | Status: AC
  Administered 2018-01-03: 1 g via INTRAVENOUS
  Filled 2018-01-03: qty 10

## 2018-01-03 NOTE — Progress Notes (Signed)
CODE SEPSIS - PHARMACY COMMUNICATION  **Broad Spectrum Antibiotics should be administered within 1 hour of Sepsis diagnosis**  Time Code Sepsis Called/Page Received: 7/24 2322  Antibiotics Ordered: 7/24 2241  Time of 1st antibiotic administration: 7/24 2257  Additional action taken by pharmacy:   If necessary, Name of Provider/Nurse Contacted:     Eloise Harman ,PharmD Clinical Pharmacist  01/03/2018  11:36 PM

## 2018-01-03 NOTE — ED Provider Notes (Signed)
[facility] Emergency Department Provider Note  ____________________________________________  Time seen: Approximately 10:41 PM  I have reviewed the triage vital signs and the nursing notes.   HISTORY  Chief Complaint No chief complaint on file.   HPI BETHZY HAUCK is a 27 y.o. female no significant past medical history who presents for evaluation of infection in her buttock region.  Patient reports that yesterday she went to urgent care for pain located in the pilonidal region and was told she had a pimple there. She was sent home on vicodin. Today the pain became worse and it is now severe, worse when she sits down, sharp, constant and nonradiating.  She reports chills and sweats today but no fever, no nausea, no vomiting.  Patient has never had an abscess before.  Past Medical History:  Diagnosis Date  . Breast mass, right 2014  . Eczema     Patient Active Problem List   Diagnosis Date Noted  . Sepsis ([place]) 01/03/2018  . Cellulitis 01/03/2018    Past Surgical History:  Procedure Laterality Date  . BREAST SURGERY Right 2014   breast mass  . CESAREAN SECTION  2011    Prior to Admission medications   Medication Sig Start Date End Date Taking? Authorizing Provider  HYDROcodone-acetaminophen (NORCO) 5-325 MG tablet Take 1 tablet by mouth every 6 (six) hours as needed for severe pain. 05/24/16  Yes Hagler, Jami L, PA-C  clindamycin (CLEOCIN) 300 MG capsule Take 1 capsule (300 mg total) by mouth 3 (three) times daily. Patient not taking: Reported on 01/03/2018 05/24/16   Hagler, Jami L, PA-C  naproxen (NAPROSYN) 500 MG tablet Take 1 tablet (500 mg total) by mouth 2 (two) times daily with a meal. Patient not taking: Reported on 01/03/2018 05/24/16   Hagler, Jami L, PA-C  traMADol (ULTRAM) 50 MG tablet Take 1 tablet (50 mg total) by mouth 2 (two) times daily. Patient not taking: Reported on 01/03/2018 10/27/16   Menshew, Dannielle Karvonen, PA-C     Allergies Tramadol and Penicillins  Family History  Adopted: Yes    Social History Social History   Tobacco Use  . Smoking status: Current Every Day Smoker    Packs/day: 0.50    Years: 7.00    Pack years: 3.50    Types: Cigarettes  . Smokeless tobacco: Never Used  Substance Use Topics  . Alcohol use: Yes    Comment: occasionally  . Drug use: No    Review of Systems  Constitutional: Negative for fever. + chills Eyes: Negative for visual changes. ENT: Negative for sore throat. Neck: No neck pain  Cardiovascular: Negative for chest pain. Respiratory: Negative for shortness of breath. Gastrointestinal: Negative for abdominal pain, vomiting or diarrhea. Genitourinary: Negative for dysuria. + buttock pain Musculoskeletal: Negative for back pain. Skin: Negative for rash. Neurological: Negative for headaches, weakness or numbness. Psych: No SI or HI  ____________________________________________   PHYSICAL EXAM:  VITAL SIGNS: ED Triage Vitals [01/03/18 2212]  Enc Vitals Group     BP (!) 136/95     Pulse Rate (!) 122     Resp 20     Temp 99.8 F (37.7 C)     Temp Source Oral     SpO2 98 %     Weight 165 lb (74.8 kg)     Height 5' 5"  (1.651 m)     Head Circumference      Peak Flow      Pain Score 9  Pain Loc      Pain Edu?      Excl. in [place]?     Constitutional: Alert and oriented. Well appearing and in no apparent distress. HEENT:      Head: Normocephalic and atraumatic.         Eyes: Conjunctivae are normal. Sclera is non-icteric.       Mouth/Throat: Mucous membranes are moist.       Neck: Supple with no signs of meningismus. Cardiovascular: Tachycardic with regular rhythm. No murmurs, gallops, or rubs. 2+ symmetrical distal pulses are present in all extremities. No JVD. Respiratory: Normal respiratory effort. Lungs are clear to auscultation bilaterally. No wheezes, crackles, or rhonchi.  Gastrointestinal: Soft, non tender, and non distended with  positive bowel sounds. No rebound or guarding. Genitourinary: No CVA tenderness. There is induration and erythema surrounding the pilonidal duct with no obvious abscess Musculoskeletal: Nontender with normal range of motion in all extremities. No edema, cyanosis, or erythema of extremities. Neurologic: Normal speech and language. Face is symmetric. Moving all extremities. No gross focal neurologic deficits are appreciated. Skin: Skin is warm, dry and intact. No rash noted. Psychiatric: Mood and affect are normal. Speech and behavior are normal.  ____________________________________________   LABS (all labs ordered are listed, but only abnormal results are displayed)  Labs Reviewed  CBC WITH DIFFERENTIAL/PLATELET - Abnormal; Notable for the following components:      Result Value   WBC 14.9 (*)    Neutro Abs 9.9 (*)    Monocytes Absolute 1.2 (*)    All other components within normal limits  BASIC METABOLIC PANEL - Abnormal; Notable for the following components:   Glucose, Bld 191 (*)    All other components within normal limits  CULTURE, BLOOD (ROUTINE X 2)  CULTURE, BLOOD (ROUTINE X 2)  LACTIC ACID, PLASMA  URINALYSIS, COMPLETE (UACMP) WITH MICROSCOPIC   ____________________________________________  EKG  none  ____________________________________________  RADIOLOGY  none  ____________________________________________   PROCEDURES  Procedure(s) performed: None Procedures Critical Care performed:  Yes  CRITICAL CARE Performed by: Rudene Re  ?  Total critical care time: 30 min  Critical care time was exclusive of separately billable procedures and treating other patients.  Critical care was necessary to treat or prevent imminent or life-threatening deterioration.  Critical care was time spent personally by me on the following activities: development of treatment plan with patient and/or surrogate as well as nursing, discussions with consultants, evaluation  of patient's response to treatment, examination of patient, obtaining history from patient or surrogate, ordering and performing treatments and interventions, ordering and review of laboratory studies, ordering and review of radiographic studies, pulse oximetry and re-evaluation of patient's condition.  ____________________________________________   INITIAL IMPRESSION / ASSESSMENT AND PLAN / ED COURSE  27 y.o. female no significant past medical history who presents for evaluation of infection in her buttock region.  Patient's physical exam consistent with a cellulitis surrounding the pilonidal duct region, there is no obvious abscess to be drained, the area is indurated, tender, erythematous, and warm.  Patient does have tachycardia and a low-grade temp of 99.44F, no fevers at home.  She is otherwise young and healthy.  Tachycardia from possible low grade temp and pain. Will check for signs of sepsis with a lactic acid and a CBC.  Will start patient on antibiotics.  Will give a dose of Bactrim and Rocephin.  If patient remains hemodynamically stable with no signs of sepsis anticipate discharge home on Keflex and Bactrim.  Otherwise  will admit patient.  Will give Tylenol and IV fluids for fever and tachycardia, will give Toradol for pain.    _________________________ 11:24 PM on 01/03/2018 -----------------------------------------  Labs concerning for elevated WBC which together with tachycardia and low grade fever makes me concerned for early sepsis. Discussed with the Hospitalist Dr. Jannifer Franklin for admission   As part of my medical decision making, I reviewed the following data within the [facility] notes reviewed and incorporated, Labs reviewed , Old chart reviewed, Discussed with admitting physician , Notes from prior ED visits and [place] Controlled Substance Database    Pertinent labs & imaging results that were available during my care of the patient were reviewed by me and  considered in my medical decision making (see chart for details).    ____________________________________________   FINAL CLINICAL IMPRESSION(S) / ED DIAGNOSES  Final diagnoses:  Sepsis, due to unspecified organism ([place])  Infected pilonidal cyst      NEW MEDICATIONS STARTED DURING THIS VISIT:  ED Discharge Orders    None       Note:  This document was prepared using Dragon voice recognition software and may include unintentional dictation errors.    Rudene Re, MD 01/03/18 [phone]

## 2018-01-03 NOTE — H&P (Signed)
[place] at [place] NAME: Mialani Reicks    MR#:  [id number]  DATE OF BIRTH:  1991/05/13  DATE OF ADMISSION:  01/03/2018  PRIMARY CARE PHYSICIAN: Patient, No Pcp Per   REQUESTING/REFERRING PHYSICIAN: Alfred Levins, MD  CHIEF COMPLAINT:  Cellulitis  HISTORY OF PRESENT ILLNESS:  Antinette Keough  is a 27 y.o. female who presents with pain in her pilonidal region.  Patient states that she went to a walk-in clinic yesterday and was told that she had a "pimple".  She was given some Vicodin and sent home.  Today the pain is significantly worse, and she had chills at home.  Here in the ED she was found to have cellulitis in the pilonidal region without any overt abscess.  She had an elevated white blood cell count and tachycardia.  Hospitalist were called for admission  PAST MEDICAL HISTORY:   Past Medical History:  Diagnosis Date  . Breast mass, right 2014  . Eczema      PAST SURGICAL HISTORY:   Past Surgical History:  Procedure Laterality Date  . BREAST SURGERY Right 2014   breast mass  . CESAREAN SECTION  2011     SOCIAL HISTORY:   Social History   Tobacco Use  . Smoking status: Current Every Day Smoker    Packs/day: 0.50    Years: 7.00    Pack years: 3.50    Types: Cigarettes  . Smokeless tobacco: Never Used  Substance Use Topics  . Alcohol use: Yes    Comment: occasionally     FAMILY HISTORY:   Family History  Adopted: Yes    Family history unknown as the patient was adopted DRUG ALLERGIES:   Allergies  Allergen Reactions  . Tramadol   . Penicillins Rash    MEDICATIONS AT HOME:   Prior to Admission medications   Medication Sig Start Date End Date Taking? Authorizing Provider  HYDROcodone-acetaminophen (NORCO) 5-325 MG tablet Take 1 tablet by mouth every 6 (six) hours as needed for severe pain. 05/24/16  Yes Hagler, Jami L, PA-C  clindamycin (CLEOCIN) 300 MG capsule Take 1 capsule (300 mg total) by  mouth 3 (three) times daily. Patient not taking: Reported on 01/03/2018 05/24/16   Hagler, Jami L, PA-C  naproxen (NAPROSYN) 500 MG tablet Take 1 tablet (500 mg total) by mouth 2 (two) times daily with a meal. Patient not taking: Reported on 01/03/2018 05/24/16   Hagler, Jami L, PA-C  traMADol (ULTRAM) 50 MG tablet Take 1 tablet (50 mg total) by mouth 2 (two) times daily. Patient not taking: Reported on 01/03/2018 10/27/16   Menshew, Dannielle Karvonen, PA-C    REVIEW OF SYSTEMS:  ROS   VITAL SIGNS:   Vitals:   01/03/18 2212 01/03/18 2302 01/03/18 2327  BP: (!) 136/95 93/78 93/78   Pulse: (!) 122 (!) 115 (!) 111  Resp: 20 20 18   Temp: 99.8 F (37.7 C)    TempSrc: Oral    SpO2: 98% 98% 99%  Weight: 74.8 kg (165 lb)    Height: 5' 5"  (1.651 m)     Wt Readings from Last 3 Encounters:  01/03/18 74.8 kg (165 lb)  10/27/16 72.6 kg (160 lb)  05/26/16 86.6 kg (191 lb)    PHYSICAL EXAMINATION:  Physical Exam  LABORATORY PANEL:   CBC Recent Labs  Lab 01/03/18 2239  WBC 14.9*  HGB 14.2  HCT 40.9  PLT 183   ------------------------------------------------------------------------------------------------------------------  Chemistries  Recent Labs  Lab  01/03/18 2239  NA 138  K 3.6  CL 105  CO2 22  GLUCOSE 191*  BUN 12  CREATININE 0.82  CALCIUM 9.0   ------------------------------------------------------------------------------------------------------------------  Cardiac Enzymes No results for input(s): TROPONINI in the last 168 hours. ------------------------------------------------------------------------------------------------------------------  RADIOLOGY:  No results found.  EKG:  No orders found for this or any previous visit.  IMPRESSION AND PLAN:  Principal Problem:   Sepsis ([place]) -due to her cellulitis as below.  Patient is hemodynamically stable with a normal lactic acid.  IV antibiotics started, culture sent. Active Problems:   Cellulitis -IV  antibiotics  Chart review performed and case discussed with ED provider. Labs, imaging and/or ECG reviewed by provider and discussed with patient/family. Management plans discussed with the patient and/or family.  DVT PROPHYLAXIS: SubQ lovenox  GI PROPHYLAXIS: None  ADMISSION STATUS: Observation  CODE STATUS: Full  TOTAL TIME TAKING CARE OF THIS PATIENT: 40 minutes.   Lysette Lindenbaum [place] 01/03/2018, 11:32 PM  [facility]  Office  [phone]  CC: Primary care physician; Patient, No Pcp Per  Note:  This document was prepared using Dragon voice recognition software and may include unintentional dictation errors.

## 2018-01-03 NOTE — ED Triage Notes (Signed)
Patient ambulatory to triage with steady gait, without difficulty or distress noted; pt reports abscess to sacral area that has been present "forever"; st seen yesterday at [facility] and dx with "pimple" and received vicodin only; since 6pm has had area firmness/swelling to lower lumbar area

## 2018-01-04 ENCOUNTER — Other Ambulatory Visit: Payer: Self-pay

## 2018-01-04 DIAGNOSIS — Z880 Allergy status to penicillin: Secondary | ICD-10-CM | POA: Diagnosis not present

## 2018-01-04 DIAGNOSIS — Z886 Allergy status to analgesic agent status: Secondary | ICD-10-CM | POA: Diagnosis not present

## 2018-01-04 DIAGNOSIS — L0591 Pilonidal cyst without abscess: Secondary | ICD-10-CM | POA: Diagnosis not present

## 2018-01-04 DIAGNOSIS — Z79899 Other long term (current) drug therapy: Secondary | ICD-10-CM | POA: Diagnosis not present

## 2018-01-04 DIAGNOSIS — F17210 Nicotine dependence, cigarettes, uncomplicated: Secondary | ICD-10-CM | POA: Diagnosis present

## 2018-01-04 DIAGNOSIS — L03317 Cellulitis of buttock: Secondary | ICD-10-CM | POA: Diagnosis present

## 2018-01-04 DIAGNOSIS — A419 Sepsis, unspecified organism: Secondary | ICD-10-CM | POA: Diagnosis present

## 2018-01-04 LAB — URINALYSIS, COMPLETE (UACMP) WITH MICROSCOPIC
Bilirubin Urine: NEGATIVE
GLUCOSE, UA: 50 mg/dL — AB
Ketones, ur: NEGATIVE mg/dL
Nitrite: NEGATIVE
PH: 6 (ref 5.0–8.0)
PROTEIN: NEGATIVE mg/dL
Specific Gravity, Urine: 1.026 (ref 1.005–1.030)

## 2018-01-04 LAB — BASIC METABOLIC PANEL
Anion gap: 7 (ref 5–15)
BUN: 11 mg/dL (ref 6–20)
CALCIUM: 8.6 mg/dL — AB (ref 8.9–10.3)
CO2: 25 mmol/L (ref 22–32)
CREATININE: 0.64 mg/dL (ref 0.44–1.00)
Chloride: 108 mmol/L (ref 98–111)
GFR calc non Af Amer: >60 mL/min (ref >60)
Glucose, Bld: 162 mg/dL — ABNORMAL HIGH (ref 70–99)
Potassium: 3.8 mmol/L (ref 3.5–5.1)
SODIUM: 140 mmol/L (ref 135–145)

## 2018-01-04 LAB — CBC
HCT: 39.3 % (ref 35.0–47.0)
Hemoglobin: 13.4 g/dL (ref 12.0–16.0)
MCH: 30.7 pg (ref 26.0–34.0)
MCHC: 34.1 g/dL (ref 32.0–36.0)
MCV: 90.1 fL (ref 80.0–100.0)
PLATELETS: 160 10*3/uL (ref 150–440)
RBC: 4.36 MIL/uL (ref 3.80–5.20)
RDW: 12.8 % (ref 11.5–14.5)
WBC: 11.6 10*3/uL — ABNORMAL HIGH (ref 3.6–11.0)

## 2018-01-04 MED ORDER — IBUPROFEN 400 MG PO TABS
400.0000 mg | ORAL_TABLET | Freq: Four times a day (QID) | ORAL | Status: DC | PRN
  Administered 2018-01-04: 400 mg via ORAL
  Filled 2018-01-04: qty 1

## 2018-01-04 MED ORDER — MORPHINE SULFATE (PF) 2 MG/ML IV SOLN
2.0000 mg | INTRAVENOUS | Status: DC | PRN
  Administered 2018-01-05 – 2018-01-06 (×3): 2 mg via INTRAVENOUS
  Filled 2018-01-04 (×4): qty 1

## 2018-01-04 MED ORDER — ACETAMINOPHEN 325 MG PO TABS: 650.0000 mg | ORAL_TABLET | Freq: Four times a day (QID) | ORAL | Status: DC | PRN

## 2018-01-04 MED ORDER — ONDANSETRON HCL 4 MG/2ML IJ SOLN: 4.0000 mg | Freq: Four times a day (QID) | INTRAMUSCULAR | Status: DC | PRN

## 2018-01-04 MED ORDER — ONDANSETRON HCL 4 MG PO TABS: 4.0000 mg | ORAL_TABLET | Freq: Four times a day (QID) | ORAL | Status: DC | PRN

## 2018-01-04 MED ORDER — ENOXAPARIN SODIUM 40 MG/0.4ML ~~LOC~~ SOLN
40.0000 mg | SUBCUTANEOUS | Status: DC
  Administered 2018-01-04 – 2018-01-05 (×2): 40 mg via SUBCUTANEOUS
  Filled 2018-01-04 (×2): qty 0.4

## 2018-01-04 MED ORDER — CLINDAMYCIN PHOSPHATE 600 MG/50ML IV SOLN
600.0000 mg | Freq: Three times a day (TID) | INTRAVENOUS | Status: DC
  Administered 2018-01-04 – 2018-01-06 (×7): 600 mg via INTRAVENOUS
  Filled 2018-01-04 (×10): qty 50

## 2018-01-04 MED ORDER — ACETAMINOPHEN 650 MG RE SUPP: 650.0000 mg | Freq: Four times a day (QID) | RECTAL | Status: DC | PRN

## 2018-01-04 MED ORDER — OXYCODONE HCL 5 MG PO TABS
5.0000 mg | ORAL_TABLET | ORAL | Status: DC | PRN
  Administered 2018-01-04 – 2018-01-06 (×10): 5 mg via ORAL
  Filled 2018-01-04 (×10): qty 1

## 2018-01-04 NOTE — Progress Notes (Signed)
[place] at [place] NAME: Kim Crawford    MR#:  [id number]  DATE OF BIRTH:  1990/10/14  SUBJECTIVE:  CHIEF COMPLAINT:  No chief complaint on file. Continues to complain of buttock pain which is moderate to severe, no events overnight per nursing staff  REVIEW OF SYSTEMS:  CONSTITUTIONAL: No fever, fatigue or weakness.  EYES: No blurred or double vision.  EARS, NOSE, AND THROAT: No tinnitus or ear pain.  RESPIRATORY: No cough, shortness of breath, wheezing or hemoptysis.  CARDIOVASCULAR: No chest pain, orthopnea, edema.  GASTROINTESTINAL: No nausea, vomiting, diarrhea or abdominal pain.  GENITOURINARY: No dysuria, hematuria.  ENDOCRINE: No polyuria, nocturia,  HEMATOLOGY: No anemia, easy bruising or bleeding SKIN: No rash or lesion. MUSCULOSKELETAL: No joint pain or arthritis.   NEUROLOGIC: No tingling, numbness, weakness.  PSYCHIATRY: No anxiety or depression.   ROS  DRUG ALLERGIES:   Allergies  Allergen Reactions  . Tramadol   . Penicillins Rash    VITALS:  Blood pressure (!) 105/53, pulse 71, temperature 98.3 F (36.8 C), temperature source Oral, resp. rate 18, height 5' 5"  (1.651 m), weight 74.8 kg (165 lb), last menstrual period 12/11/2017, SpO2 98 %.  PHYSICAL EXAMINATION:  GENERAL:  27 y.o.-year-old patient lying in the bed with no acute distress.  EYES: Pupils equal, round, reactive to light and accommodation. No scleral icterus. Extraocular muscles intact.  HEENT: Head atraumatic, normocephalic. Oropharynx and nasopharynx clear.  NECK:  Supple, no jugular venous distention. No thyroid enlargement, no tenderness.  LUNGS: Normal breath sounds bilaterally, no wheezing, rales,rhonchi or crepitation. No use of accessory muscles of respiration.  CARDIOVASCULAR: S1, S2 normal. No murmurs, rubs, or gallops.  ABDOMEN: Soft, nontender, nondistended. Bowel sounds present. No organomegaly or mass.  EXTREMITIES: No pedal edema,  cyanosis, or clubbing.  NEUROLOGIC: Cranial nerves II through XII are intact. Muscle strength 5/5 in all extremities. Sensation intact. Gait not checked.  PSYCHIATRIC: The patient is alert and oriented x 3.  SKIN: No obvious rash, lesion, or ulcer.   Physical Exam LABORATORY PANEL:   CBC Recent Labs  Lab 01/04/18 0313  WBC 11.6*  HGB 13.4  HCT 39.3  PLT 160   ------------------------------------------------------------------------------------------------------------------  Chemistries  Recent Labs  Lab 01/04/18 0313  NA 140  K 3.8  CL 108  CO2 25  GLUCOSE 162*  BUN 11  CREATININE 0.64  CALCIUM 8.6*   ------------------------------------------------------------------------------------------------------------------  Cardiac Enzymes No results for input(s): TROPONINI in the last 168 hours. ------------------------------------------------------------------------------------------------------------------  RADIOLOGY:  No results found.  ASSESSMENT AND PLAN:  *Acute sepsis Resolved Secondary to acute ? infected pilonidal cyst/cellulitis Continue sepsis protocol, empiric IV clindamycin, IV fluids for rehydration, follow-up on cultures  *Acute ?  Pilonidal cyst/cellulitis Stable Consult general surgery for expert opinion, all other plans as stated above  *Acute pain syndrome Secondary to above Add IV morphine to current regiment for breakthrough pain   All the records are reviewed and case discussed with Care Management/Social Workerr. Management plans discussed with the patient, family and they are in agreement.  CODE STATUS: full  TOTAL TIME TAKING CARE OF THIS PATIENT: 35 minutes.     POSSIBLE D/C IN 1-3 DAYS, DEPENDING ON CLINICAL CONDITION.   Avel Peace Salary M.D on 01/04/2018   Between 7am to 6pm - Pager - [phone]  After 6pm go to [email or web address] - password [email or web address] [facility]  Office  [phone]  CC: Primary care  physician; Patient, No Pcp Per  Note: This dictation was prepared with Dragon dictation along with smaller phrase technology. Any transcriptional errors that result from this process are unintentional.

## 2018-01-05 LAB — HIV ANTIBODY (ROUTINE TESTING W REFLEX): HIV Screen 4th Generation wRfx: NONREACTIVE

## 2018-01-05 MED ORDER — CLINDAMYCIN HCL 300 MG PO CAPS: 300.0000 mg | ORAL_CAPSULE | Freq: Three times a day (TID) | ORAL | 0 refills | Status: DC

## 2018-01-05 NOTE — Progress Notes (Signed)
[place] at [place] NAME: Kim Crawford    MR#:  [id number]  DATE OF BIRTH:  Aug 03, 1990  SUBJECTIVE:  CHIEF COMPLAINT:  No chief complaint on file. Patient feeling better, no complaints, gluteal pain is much improved  REVIEW OF SYSTEMS:  CONSTITUTIONAL: No fever, fatigue or weakness.  EYES: No blurred or double vision.  EARS, NOSE, AND THROAT: No tinnitus or ear pain.  RESPIRATORY: No cough, shortness of breath, wheezing or hemoptysis.  CARDIOVASCULAR: No chest pain, orthopnea, edema.  GASTROINTESTINAL: No nausea, vomiting, diarrhea or abdominal pain.  GENITOURINARY: No dysuria, hematuria.  ENDOCRINE: No polyuria, nocturia,  HEMATOLOGY: No anemia, easy bruising or bleeding SKIN: No rash or lesion. MUSCULOSKELETAL: No joint pain or arthritis.   NEUROLOGIC: No tingling, numbness, weakness.  PSYCHIATRY: No anxiety or depression.   ROS  DRUG ALLERGIES:   Allergies  Allergen Reactions  . Tramadol   . Penicillins Rash    VITALS:  Blood pressure 106/62, pulse 76, temperature (!) 97.4 F (36.3 C), temperature source Oral, resp. rate 20, height 5' 5"  (1.651 m), weight 74.8 kg (165 lb), last menstrual period 12/11/2017, SpO2 99 %.  PHYSICAL EXAMINATION:  GENERAL:  27 y.o.-year-old patient lying in the bed with no acute distress.  EYES: Pupils equal, round, reactive to light and accommodation. No scleral icterus. Extraocular muscles intact.  HEENT: Head atraumatic, normocephalic. Oropharynx and nasopharynx clear.  NECK:  Supple, no jugular venous distention. No thyroid enlargement, no tenderness.  LUNGS: Normal breath sounds bilaterally, no wheezing, rales,rhonchi or crepitation. No use of accessory muscles of respiration.  CARDIOVASCULAR: S1, S2 normal. No murmurs, rubs, or gallops.  ABDOMEN: Soft, nontender, nondistended. Bowel sounds present. No organomegaly or mass.  EXTREMITIES: No pedal edema, cyanosis, or clubbing.  NEUROLOGIC:  Cranial nerves II through XII are intact. Muscle strength 5/5 in all extremities. Sensation intact. Gait not checked.  PSYCHIATRIC: The patient is alert and oriented x 3.  SKIN: No obvious rash, lesion, or ulcer.   Physical Exam LABORATORY PANEL:   CBC Recent Labs  Lab 01/04/18 0313  WBC 11.6*  HGB 13.4  HCT 39.3  PLT 160   ------------------------------------------------------------------------------------------------------------------  Chemistries  Recent Labs  Lab 01/04/18 0313  NA 140  K 3.8  CL 108  CO2 25  GLUCOSE 162*  BUN 11  CREATININE 0.64  CALCIUM 8.6*   ------------------------------------------------------------------------------------------------------------------  Cardiac Enzymes No results for input(s): TROPONINI in the last 168 hours. ------------------------------------------------------------------------------------------------------------------  RADIOLOGY:  No results found.  ASSESSMENT AND PLAN:  *Acute sepsis Resolved Secondary to acute ? infected pilonidal cyst/cellulitis Continue sepsis protocol, follow-up on cultures  *Acute ?  Pilonidal cyst/cellulitis Stable General surgery consult if expert opinion   *Acute pain syndrome Which improved on current regiment  Disposition at home on tomorrow barring any complications  All the records are reviewed and case discussed with Care Management/Social Workerr. Management plans discussed with the patient, family and they are in agreement.  CODE STATUS: full  TOTAL TIME TAKING CARE OF THIS PATIENT: 35 minutes.     POSSIBLE D/C IN 1 DAYS, DEPENDING ON CLINICAL CONDITION.   Avel Peace Nashali Ditmer M.D on 01/05/2018   Between 7am to 6pm - Pager - [phone]  After 6pm go to [email or web address] - password [email or web address] [facility]  Office  [phone]  CC: Primary care physician; Patient, No Pcp Per  Note: This dictation was prepared with Dragon dictation along with smaller  phrase technology. Any transcriptional errors that result  from this process are unintentional.

## 2018-01-05 NOTE — Care Management (Signed)
Patient admitted with Acute sepsis.  Anticipated discharge for 01/06/18.  Script has been written for Oral Clinda.  Patient request that coupon be printed for [facility].  Out of pocket price with coupon is $17.40.  Patient denies issues obtaining this medication.  Patient does not have PCP.  Provided application for Medication Management  And [facility]  For discharge.

## 2018-01-06 NOTE — Consult Note (Signed)
SURGICAL CONSULTATION NOTE   HISTORY OF PRESENT ILLNESS (HPI):  27 y.o. female admitted due to cellulitis and infected pilonidal cyst. The patient refers that is has been painful since a week ago. The pain continue to got worse. Pain located in the sacral area. Pain did not radiated. Pain aggravated by pressure. Pain improved with antibiotic therapy. Refers it drained significant amount of secretions. Denies fever.   PAST MEDICAL HISTORY (PMH):  Past Medical History:  Diagnosis Date  . Breast mass, right 2014  . Eczema      PAST SURGICAL HISTORY [facility]):  Past Surgical History:  Procedure Laterality Date  . BREAST SURGERY Right 2014   breast mass  . CESAREAN SECTION  2011     MEDICATIONS:  Prior to Admission medications   Medication Sig Start Date End Date Taking? Authorizing Provider  HYDROcodone-acetaminophen (NORCO) 5-325 MG tablet Take 1 tablet by mouth every 6 (six) hours as needed for severe pain. 05/24/16  Yes Hagler, Jami L, PA-C  clindamycin (CLEOCIN) 300 MG capsule Take 1 capsule (300 mg total) by mouth 3 (three) times daily. Patient not taking: Reported on 01/03/2018 05/24/16   Hagler, Jami L, PA-C  clindamycin (CLEOCIN) 300 MG capsule Take 1 capsule (300 mg total) by mouth 3 (three) times daily. 01/05/18   Salary, Avel Peace, MD  naproxen (NAPROSYN) 500 MG tablet Take 1 tablet (500 mg total) by mouth 2 (two) times daily with a meal. Patient not taking: Reported on 01/03/2018 05/24/16   Hagler, Jami L, PA-C  traMADol (ULTRAM) 50 MG tablet Take 1 tablet (50 mg total) by mouth 2 (two) times daily. Patient not taking: Reported on 01/03/2018 10/27/16   Menshew, Dannielle Karvonen, PA-C     ALLERGIES:  Allergies  Allergen Reactions  . Tramadol   . Penicillins Rash     SOCIAL HISTORY:  Social History   Socioeconomic History  . Marital status: Single    Spouse name: Not on file  . Number of children: Not on file  . Years of education: Not on file  . Highest education level:  Not on file  Occupational History  . Not on file  Social Needs  . Financial resource strain: Not on file  . Food insecurity:    Worry: Not on file    Inability: Not on file  . Transportation needs:    Medical: Not on file    Non-medical: Not on file  Tobacco Use  . Smoking status: Current Every Day Smoker    Packs/day: 0.50    Years: 7.00    Pack years: 3.50    Types: Cigarettes  . Smokeless tobacco: Never Used  Substance and Sexual Activity  . Alcohol use: Yes    Comment: occasionally  . Drug use: No  . Sexual activity: Not on file  Lifestyle  . Physical activity:    Days per week: Not on file    Minutes per session: Not on file  . Stress: Not on file  Relationships  . Social connections:    Talks on phone: Not on file    Gets together: Not on file    Attends religious service: Not on file    Active member of club or organization: Not on file    Attends meetings of clubs or organizations: Not on file    Relationship status: Not on file  . Intimate partner violence:    Fear of current or ex partner: Not on file    Emotionally abused: Not on  file    Physically abused: Not on file    Forced sexual activity: Not on file  Other Topics Concern  . Not on file  Social History Narrative  . Not on file    The patient currently resides (home / rehab facility / nursing home): Home The patient normally is (ambulatory / bedbound): Ambulatory   FAMILY HISTORY:  Family History  Adopted: Yes     REVIEW OF SYSTEMS:  Constitutional: denies weight loss, fever, chills, or sweats  Eyes: denies any other vision changes, history of eye injury  ENT: denies sore throat, hearing problems  Respiratory: denies shortness of breath, wheezing  Cardiovascular: denies chest pain, palpitations  Gastrointestinal: denies abdominal pain, N/V,  Genitourinary: denies burning with urination or urinary frequency Musculoskeletal: denies any other joint pains or cramps  Skin: denies any other  rashes or skin discolorations. Positive for pilonidal cyst.  Neurological: denies any other headache, dizziness, weakness  Psychiatric: denies any other depression, anxiety   All other review of systems were negative   VITAL SIGNS:  Temp:  [97.6 F (36.4 C)-98.6 F (37 C)] 97.6 F (36.4 C) (07/27 0551) Pulse Rate:  [80-90] 86 (07/27 0551) Resp:  [14-20] 20 (07/27 0551) BP: (102-124)/(60-74) 102/62 (07/27 0551) SpO2:  [96 %-98 %] 98 % (07/27 0551)     Height: 5' 5"  (165.1 cm) Weight: 74.8 kg (165 lb) BMI (Calculated): 27.46   INTAKE/OUTPUT:  This shift: Total I/O In: 190 [IV Piggyback:190] Out: 0   Last 2 shifts: @IOLAST2SHIFTS @   PHYSICAL EXAM:  Constitutional:  -- Normal body habitus  -- Awake, alert, and oriented x3  Eyes:  -- Pupils equally round and reactive to light  -- No scleral icterus  Ear, nose, and throat:  -- No jugular venous distension  Pulmonary:  -- No crackles  -- Equal breath sounds bilaterally -- Breathing non-labored at rest Cardiovascular:  -- S1, S2 present  -- No pericardial rubs Gastrointestinal:  -- Abdomen soft, nontender, non-distended, no guarding or rebound tenderness -- No abdominal masses appreciated, pulsatile or otherwise  Musculoskeletal and Integumentary:  -- Wounds or skin discoloration: pilonidal cyst with mild inflammation, resolved, mild tender, no erythema.  -- Extremities: B/L UE and LE FROM, hands and feet warm, no edema  Neurologic:  -- Motor function: intact and symmetric -- Sensation: intact and symmetric   Labs:  CBC Latest Ref Rng & Units 01/04/2018 01/03/2018 05/24/2016  WBC 3.6 - 11.0 K/uL 11.6(H) 14.9(H) 12.8(H)  Hemoglobin 12.0 - 16.0 g/dL 13.4 14.2 15.1  Hematocrit 35.0 - 47.0 % 39.3 40.9 43.9  Platelets 150 - 440 K/uL 160 183 165   CMP Latest Ref Rng & Units 01/04/2018 01/03/2018 05/24/2016  Glucose 70 - 99 mg/dL 162(H) 191(H) 111(H)  BUN 6 - 20 mg/dL 11 12 14   Creatinine 0.44 - 1.00 mg/dL 0.64 0.82 0.65   Sodium 135 - 145 mmol/L 140 138 138  Potassium 3.5 - 5.1 mmol/L 3.8 3.6 3.7  Chloride 98 - 111 mmol/L 108 105 105  CO2 22 - 32 mmol/L 25 22 26   Calcium 8.9 - 10.3 mg/dL 8.6(L) 9.0 9.0   Imaging studies:  None  Assessment/Plan: 27 y.o. female with infected pilonidal cyst that required IV abx due to leukocytosis and cellulitis. Spontaneously drained and responded to IV abx therapy. Patient already evaluated by Dr. Dahlia Byes after one episode of mild infection. Since it was the first one, conservative management was tried. Dr. Dahlia Byes recommended that if this problem recurred or gets complicated,  to go back to his clinic for evaluation and discussion of possible surgery. Patient oriented to make an outpatient appointment with Dr. Dahlia Byes. Oriented to complete oral antibiotic therapy. Also oriented to keep area clean and dry and after resolution of inflammation to try a depilatory mechanism on the area (laser, nair cream, not shaving).   All of the above findings and recommendations were discussed with the patient and her mother, and all questions were answered to their expressed satisfaction.  Arnold Long, MD

## 2018-01-06 NOTE — Progress Notes (Signed)
Kenton was admitted to the Hospital on 01/03/2018 and Discharged  01/06/2018 and should be excused from work/school   for 3 days starting 01/03/2018 , may return to work/school without any restrictions.  Call Abel Presto MD, [facility]  [phone] with questions.  Henreitta Leber M.D on 01/06/2018,at 1:31 PM

## 2018-01-06 NOTE — Discharge Summary (Signed)
[place] at [place] NAME: Kim Crawford    MR#:  [id number]  DATE OF BIRTH:  [birth year]  DATE OF ADMISSION:  01/03/2018 ADMITTING PHYSICIAN: Lance Coon, MD  DATE OF DISCHARGE: 01/06/2018  2:32 PM  PRIMARY CARE PHYSICIAN: Patient, No Pcp Per    ADMISSION DIAGNOSIS:  Infected pilonidal cyst [L05.91] Sepsis, due to unspecified organism ([place]) [A41.9]  DISCHARGE DIAGNOSIS:  Principal Problem:   Sepsis ([place]) Active Problems:   Cellulitis   SECONDARY DIAGNOSIS:   Past Medical History:  Diagnosis Date  . Breast mass, right 2014  . Eczema     HOSPITAL COURSE:   27 year old female with past medical history of eczema who presented to the hospital due to cellulitis with underlying pilonidal cyst.  1.  Pilonidal cyst with surrounding cellulitis-this was a cause of patient's fever, chills, tachycardia. - Patient was admitted to the hospital and started on IV clindamycin.  Patient has some spontaneous drainage of a pilonidal cyst and the size has reduced.  She has less redness and swelling. - She is being discharged on oral clindamycin with outpatient follow-up with surgery.  2.  Sepsis-secondary to underlying cellulitis with pilonidal cyst infection.  Improved and resolved with supportive care with IV fluids, IV antibiotics as mentioned above.  Her blood cultures remain negative.  DISCHARGE CONDITIONS:   Stable  CONSULTS OBTAINED:  Treatment Team:  Benjamine Sprague, DO  DRUG ALLERGIES:   Allergies  Allergen Reactions  . Tramadol   . Penicillins Rash    DISCHARGE MEDICATIONS:   Allergies as of 01/06/2018      Reactions   Tramadol    Penicillins Rash      Medication List    TAKE these medications   clindamycin 300 MG capsule Commonly known as:  CLEOCIN Take 1 capsule (300 mg total) by mouth 3 (three) times daily. What changed:  Another medication with the same name was added. Make sure you understand how and when to take  each.   clindamycin 300 MG capsule Commonly known as:  CLEOCIN Take 1 capsule (300 mg total) by mouth 3 (three) times daily. What changed:  You were already taking a medication with the same name, and this prescription was added. Make sure you understand how and when to take each.   HYDROcodone-acetaminophen 5-325 MG tablet Commonly known as:  NORCO Take 1 tablet by mouth every 6 (six) hours as needed for severe pain.   naproxen 500 MG tablet Commonly known as:  NAPROSYN Take 1 tablet (500 mg total) by mouth 2 (two) times daily with a meal.   traMADol 50 MG tablet Commonly known as:  ULTRAM Take 1 tablet (50 mg total) by mouth 2 (two) times daily.         DISCHARGE INSTRUCTIONS:   DIET:  Regular diet  DISCHARGE CONDITION:  Stable  ACTIVITY:  Activity as tolerated  OXYGEN:  Home Oxygen: No.   Oxygen Delivery: room air  DISCHARGE LOCATION:  home   If you experience worsening of your admission symptoms, develop shortness of breath, life threatening emergency, suicidal or homicidal thoughts you must seek medical attention immediately by calling 911 or calling your MD immediately  if symptoms less severe.  You Must read complete instructions/literature along with all the possible adverse reactions/side effects for all the Medicines you take and that have been prescribed to you. Take any new Medicines after you have completely understood and accpet all the possible adverse reactions/side effects.  Please note  You were cared for by a hospitalist during your hospital stay. If you have any questions about your discharge medications or the care you received while you were in the hospital after you are discharged, you can call the unit and asked to speak with the hospitalist on call if the hospitalist that took care of you is not available. Once you are discharged, your primary care physician will handle any further medical issues. Please note that NO REFILLS for any discharge  medications will be authorized once you are discharged, as it is imperative that you return to your primary care physician (or establish a relationship with a primary care physician if you do not have one) for your aftercare needs so that they can reassess your need for medications and monitor your lab values.     Today   Redness swelling and pain on her left buttock cheek is improved.  No fever, hemodynamically stable.  Will discharge on oral antibiotics with outpatient surgical referral.  VITAL SIGNS:  Blood pressure 102/62, pulse 86, temperature 97.6 F (36.4 C), temperature source Oral, resp. rate 20, height 5' 5"  (1.651 m), weight 74.8 kg (165 lb), last menstrual period 12/11/2017, SpO2 98 %.  I/O:    Intake/Output Summary (Last 24 hours) at 01/06/2018 1540 Last data filed at 01/06/2018 0935 Gross per 24 hour  Intake 690 ml  Output 550 ml  Net 140 ml    PHYSICAL EXAMINATION:  GENERAL:  27 y.o.-year-old patient lying in the bed with no acute distress.  EYES: Pupils equal, round, reactive to light and accommodation. No scleral icterus. Extraocular muscles intact.  HEENT: Head atraumatic, normocephalic. Oropharynx and nasopharynx clear.  NECK:  Supple, no jugular venous distention. No thyroid enlargement, no tenderness.  LUNGS: Normal breath sounds bilaterally, no wheezing, rales,rhonchi. No use of accessory muscles of respiration.  CARDIOVASCULAR: S1, S2 normal. No murmurs, rubs, or gallops.  ABDOMEN: Soft, non-tender, non-distended. Bowel sounds present. No organomegaly or mass.  EXTREMITIES: No pedal edema, cyanosis, or clubbing.  NEUROLOGIC: Cranial nerves II through XII are intact. No focal motor or sensory defecits b/l.  PSYCHIATRIC: The patient is alert and oriented x 3.  SKIN: No obvious rash, lesion, or ulcer. Left buttock cheek Pilonidal cyst but no surrounding redness or inflammation.   DATA REVIEW:   CBC Recent Labs  Lab 01/04/18 0313  WBC 11.6*  HGB 13.4  HCT  39.3  PLT 160    Chemistries  Recent Labs  Lab 01/04/18 0313  NA 140  K 3.8  CL 108  CO2 25  GLUCOSE 162*  BUN 11  CREATININE 0.64  CALCIUM 8.6*    Cardiac Enzymes No results for input(s): TROPONINI in the last 168 hours.  Microbiology Results  Results for orders placed or performed during the hospital encounter of 01/03/18  Blood culture (routine x 2)     Status: None (Preliminary result)   Collection Time: 01/03/18 10:39 PM  Result Value Ref Range Status   Specimen Description BLOOD BLOOD RIGHT HAND  Final   Special Requests   Final    BOTTLES DRAWN AEROBIC AND ANAEROBIC Blood Culture adequate volume   Culture   Final    NO GROWTH 3 DAYS Performed at [facility], [address] [place], [place] [address]    Report Status PENDING  Incomplete  Blood culture (routine x 2)     Status: None (Preliminary result)   Collection Time: 01/03/18 10:39 PM  Result Value Ref Range Status  Specimen Description BLOOD RIGHT ANTECUBITAL  Final   Special Requests   Final    BOTTLES DRAWN AEROBIC AND ANAEROBIC Blood Culture adequate volume   Culture   Final    NO GROWTH 3 DAYS Performed at [facility], [address], [place], [place] [address]    Report Status PENDING  Incomplete    RADIOLOGY:  No results found.    Management plans discussed with the patient, family and they are in agreement.  CODE STATUS:     Code Status Orders  (From admission, onward)        Start     Ordered   01/04/18 0055  Full code  Continuous     01/04/18 0054    Code Status History    This patient has a current code status but no historical code status.      TOTAL TIME TAKING CARE OF THIS PATIENT: 40 minutes.    Henreitta Leber M.D on 01/06/2018 at 3:40 PM  Between 7am to 6pm - Pager - [phone]  After 6pm go to [email or web address] - Proofreader  [facility]  Office  [phone]  CC: Primary care physician; Patient, No  Pcp Per

## 2018-01-06 NOTE — Progress Notes (Signed)
Discharge teaching given to patient, patient verbalized understanding and had no questions. Patient IV removed. Patient will be transported home by family. All patient belongings gathered prior to leaving.  

## 2018-01-08 LAB — CULTURE, BLOOD (ROUTINE X 2)
CULTURE: NO GROWTH
Culture: NO GROWTH
SPECIAL REQUESTS: ADEQUATE
Special Requests: ADEQUATE

## 2018-02-11 ENCOUNTER — Encounter: Payer: Self-pay | Admitting: Emergency Medicine

## 2018-02-11 DIAGNOSIS — F17210 Nicotine dependence, cigarettes, uncomplicated: Secondary | ICD-10-CM | POA: Diagnosis not present

## 2018-02-11 DIAGNOSIS — Z79899 Other long term (current) drug therapy: Secondary | ICD-10-CM | POA: Insufficient documentation

## 2018-02-11 DIAGNOSIS — L0501 Pilonidal cyst with abscess: Secondary | ICD-10-CM | POA: Insufficient documentation

## 2018-02-11 NOTE — ED Triage Notes (Signed)
Patient states that she was admitted here about 3 weeks ago for a pilonidal cyst. Patient states that about a week ago the cyst came back. Patient states that she started developing pain on Thursday and now has green drainage.

## 2018-02-12 ENCOUNTER — Emergency Department
Admission: EM | Admit: 2018-02-12 | Discharge: 2018-02-12 | Disposition: A | Discharge status: Home / Self Care | Payer: Medicaid Other | Attending: Emergency Medicine | Admitting: Emergency Medicine

## 2018-02-12 DIAGNOSIS — L0591 Pilonidal cyst without abscess: Secondary | ICD-10-CM

## 2018-02-12 MED ORDER — CIPROFLOXACIN HCL 500 MG PO TABS: 500.0000 mg | ORAL_TABLET | Freq: Two times a day (BID) | ORAL | 0 refills | Status: AC

## 2018-02-12 MED ORDER — HYDROCODONE-ACETAMINOPHEN 5-325 MG PO TABS
2.0000 | ORAL_TABLET | Freq: Once | ORAL | Status: AC
  Administered 2018-02-12: 2 via ORAL
  Filled 2018-02-12: qty 2

## 2018-02-12 MED ORDER — METRONIDAZOLE 500 MG PO TABS: 500.0000 mg | ORAL_TABLET | Freq: Three times a day (TID) | ORAL | 0 refills | Status: AC

## 2018-02-12 MED ORDER — METRONIDAZOLE 500 MG PO TABS
500.0000 mg | ORAL_TABLET | Freq: Once | ORAL | Status: AC
  Administered 2018-02-12: 500 mg via ORAL
  Filled 2018-02-12: qty 1

## 2018-02-12 MED ORDER — LIDOCAINE-EPINEPHRINE 2 %-1:100000 IJ SOLN
10.0000 mL | Freq: Once | INTRAMUSCULAR | Status: DC
  Filled 2018-02-12: qty 1

## 2018-02-12 MED ORDER — CIPROFLOXACIN HCL 500 MG PO TABS
500.0000 mg | ORAL_TABLET | Freq: Once | ORAL | Status: AC
  Administered 2018-02-12: 500 mg via ORAL
  Filled 2018-02-12: qty 1

## 2018-02-12 MED ORDER — HYDROCODONE-ACETAMINOPHEN 5-325 MG PO TABS: 1.0000 | ORAL_TABLET | Freq: Four times a day (QID) | ORAL | 0 refills | Status: DC | PRN

## 2018-02-12 NOTE — ED Notes (Signed)
Patient updated on wait time. Patient verbalizes understanding.

## 2018-02-12 NOTE — ED Notes (Signed)
Pt reports hx of pilonidal cyst. Has been admitted in the past for IV antibiotics. Scheduled to have surgery for removal on 02/26/18. Area has been draining green purulent drainage for the last few days. Area at this time has no drainage and is very small in size. No redness noted.

## 2018-02-12 NOTE — ED Provider Notes (Signed)
[facility] Emergency Department Provider Note  ____________________________________________   First MD Initiated Contact with Patient 02/12/18 0109     (approximate)  I have reviewed the triage vital signs and the nursing notes.   HISTORY  Chief Complaint No chief complaint on file.   HPI Kim Crawford is a 27 y.o. female who comes to the emergency department with roughly 1 week of increasing pain at the cleft of her buttocks.  About 3 weeks ago she was admitted to the hospital with a pilonidal abscess.  It was never operated on or incised and drained.  It spontaneously drained and she was treated with antibiotics.  She has an appointment in 2 weeks on September 16 for surgical removal.  Over the past week she has had some increasing pain and discharge.  Her mother has been able to "push on it" and expressed a small amount of material.  No fevers or chills.  Pain is sharp and severe when sitting on it or bumping into it.  Improved when not.    Past Medical History:  Diagnosis Date  . Breast mass, right 2014  . Eczema     Patient Active Problem List   Diagnosis Date Noted  . Sepsis ([place]) 01/03/2018  . Cellulitis 01/03/2018    Past Surgical History:  Procedure Laterality Date  . BREAST SURGERY Right 2014   breast mass  . CESAREAN SECTION  2011    Prior to Admission medications   Medication Sig Start Date End Date Taking? Authorizing Provider  ciprofloxacin (CIPRO) 500 MG tablet Take 1 tablet (500 mg total) by mouth 2 (two) times daily for 7 days. 02/12/18 02/19/18  Darel Hong, MD  clindamycin (CLEOCIN) 300 MG capsule Take 1 capsule (300 mg total) by mouth 3 (three) times daily. Patient not taking: Reported on 01/03/2018 05/24/16   Hagler, Jami L, PA-C  clindamycin (CLEOCIN) 300 MG capsule Take 1 capsule (300 mg total) by mouth 3 (three) times daily. 01/05/18   Salary, Avel Peace, MD  HYDROcodone-acetaminophen (NORCO) 5-325 MG tablet Take 1  tablet by mouth every 6 (six) hours as needed for up to 7 doses for severe pain. 02/12/18   Darel Hong, MD  metroNIDAZOLE (FLAGYL) 500 MG tablet Take 1 tablet (500 mg total) by mouth 3 (three) times daily for 7 days. 02/12/18 02/19/18  Darel Hong, MD  naproxen (NAPROSYN) 500 MG tablet Take 1 tablet (500 mg total) by mouth 2 (two) times daily with a meal. Patient not taking: Reported on 01/03/2018 05/24/16   Hagler, Jami L, PA-C  traMADol (ULTRAM) 50 MG tablet Take 1 tablet (50 mg total) by mouth 2 (two) times daily. Patient not taking: Reported on 01/03/2018 10/27/16   Menshew, Dannielle Karvonen, PA-C    Allergies Tramadol and Penicillins  Family History  Adopted: Yes    Social History Social History   Tobacco Use  . Smoking status: Current Every Day Smoker    Packs/day: 0.50    Years: 7.00    Pack years: 3.50    Types: Cigarettes  . Smokeless tobacco: Never Used  Substance Use Topics  . Alcohol use: Yes    Comment: occasionally  . Drug use: No    Review of Systems Constitutional: No fever/chills Cardiovascular: Denies chest pain. Respiratory: Denies shortness of breath. Gastrointestinal: No abdominal pain.  No nausea, no vomiting.   Musculoskeletal: Negative for back pain. Neurological: Negative for headaches   ____________________________________________   PHYSICAL EXAM:  VITAL SIGNS: ED  Triage Vitals  Enc Vitals Group     BP 02/11/18 2346 127/87     Pulse Rate 02/11/18 2346 (!) 105     Resp 02/11/18 2346 18     Temp 02/11/18 2346 98.4 F (36.9 C)     Temp Source 02/11/18 2346 Oral     SpO2 02/11/18 2346 98 %     Weight 02/11/18 2349 160 lb (72.6 kg)     Height 02/11/18 2349 5' 5"  (1.651 m)     Head Circumference --      Peak Flow --      Pain Score 02/11/18 2348 8     Pain Loc --      Pain Edu? --      Excl. in [place]? --     Constitutional: Alert and oriented x4 pleasant cooperative speaks in full clear sentences no diaphoresis Head: Atraumatic. Nose:  No congestion/rhinnorhea. Mouth/Throat: No trismus Neck: No stridor.   Cardiovascular: Regular rate and rhythm Respiratory: Normal respiratory effort.  No retractions. Gastrointestinal: Soft nontender Neurologic:  Normal speech and language. No gross focal neurologic deficits are appreciated.  Skin: Fistula to gluteal cleft.  No overlying abscess or cellulitis.  Scant discharge is thin    ____________________________________________  LABS (all labs ordered are listed, but only abnormal results are displayed)  Labs Reviewed - No data to display   __________________________________________  EKG   ____________________________________________  RADIOLOGY   ____________________________________________   DIFFERENTIAL includes but not limited to  Pilonidal cyst, pilonidal abscess, cellulitis   PROCEDURES  Procedure(s) performed: no  Procedures  Critical Care performed: no  ____________________________________________   INITIAL IMPRESSION / ASSESSMENT AND PLAN / ED COURSE  Pertinent labs & imaging results that were available during my care of the patient were reviewed by me and considered in my medical decision making (see chart for details).   As part of my medical decision making, I reviewed the following data within the [place] History obtained from family if available, nursing notes, old chart and ekg, as well as notes from prior ED visits.  The patient arrives hemodynamically stable and well-appearing.  She has a clear fistula where her previous pilonidal cyst had been.  It does not appear to be an abscess at this point I think it would cause more harm than good by incision and drainage as its freely draining at this point.  She is allergic to penicillin so instead of Augmentin I will cover her with Cipro and Flagyl.  Sitz bath discussed and she will keep surgery follow-up as scheduled.       ____________________________________________   FINAL CLINICAL IMPRESSION(S) / ED DIAGNOSES  Final diagnoses:  Pilonidal cyst      NEW MEDICATIONS STARTED DURING THIS VISIT:  Discharge Medication List as of 02/12/2018  1:20 AM    START taking these medications   Details  ciprofloxacin (CIPRO) 500 MG tablet Take 1 tablet (500 mg total) by mouth 2 (two) times daily for 7 days., Starting Mon 02/12/2018, Until Mon 02/19/2018, Print    metroNIDAZOLE (FLAGYL) 500 MG tablet Take 1 tablet (500 mg total) by mouth 3 (three) times daily for 7 days., Starting Mon 02/12/2018, Until Mon 02/19/2018, Print         Note:  This document was prepared using Dragon voice recognition software and may include unintentional dictation errors.      Darel Hong, MD 02/12/18 [phone]

## 2018-02-12 NOTE — Discharge Instructions (Signed)
Please take both of your antibiotics as prescribed and make sure you do not drink any alcohol while you are taking the Flagyl or will make you sick to her stomach.  Make sure you perform sitz baths at home twice a day until your follow-up with general surgery.  Return to the emergency department for any concerns.  It was a pleasure to take care of you today, and thank you for coming to our emergency department.  If you have any questions or concerns before leaving please ask the nurse to grab me and I'm more than happy to go through your aftercare instructions again.  If you were prescribed any opioid pain medication today such as Norco, Vicodin, Percocet, morphine, hydrocodone, or oxycodone please make sure you do not drive when you are taking this medication as it can alter your ability to drive safely.  If you have any concerns once you are home that you are not improving or are in fact getting worse before you can make it to your follow-up appointment, please do not hesitate to call [phone] and come back for further evaluation.  Darel Hong, MD

## 2018-02-26 ENCOUNTER — Encounter: Payer: Self-pay | Admitting: Surgery

## 2018-02-26 ENCOUNTER — Telehealth: Payer: Self-pay | Admitting: Surgery

## 2018-02-26 ENCOUNTER — Ambulatory Visit: Payer: Managed Care, Other (non HMO) | Admitting: Surgery

## 2018-02-26 VITALS — BP 132/100 | HR 89 | Temp 97.7°F | Resp 18 | Ht 65.0 in | Wt 196.0 lb

## 2018-02-26 DIAGNOSIS — L988 Other specified disorders of the skin and subcutaneous tissue: Secondary | ICD-10-CM

## 2018-02-26 MED ORDER — CLINDAMYCIN PHOSPHATE 900 MG/50ML IV SOLN
900.0000 mg | INTRAVENOUS | Status: AC
  Administered 2018-02-27: 900 mg via INTRAVENOUS

## 2018-02-26 NOTE — H&P (View-Only) (Signed)
Outpatient Surgical Follow Up  02/26/2018  Kim Crawford is an 27 y.o. female.   Chief Complaint  Patient presents with  . New Patient (Initial Visit)    Pilonidal cyst    HPI: Kim Crawford the is a 27 year old female well-known to me with a history of obesity, tobacco abuse and recurrent pilonidal disease.  She has had actually hospitalized in the past and requiring IV antibiotic from sepsis from her pilonidal disease.  I saw her more than a year ago and unfortunately she lost follow-up. He has decreased smoking and is down to quarter pack a day.  She is able to perform more than 4 METS of activity without any shortness of breath or chest pain.  She recently has been treated with Cipro and Flagyl and completed the antibiotic course.  No pain some minimal scant drainage from the coccyx area  Past Medical History:  Diagnosis Date  . Breast mass, right 2014  . Eczema     Past Surgical History:  Procedure Laterality Date  . BREAST SURGERY Right 2014   breast mass  . CESAREAN SECTION  2011    Family History  Adopted: Yes  Problem Relation Age of Onset  . Parkinson's disease Father     Social History:  reports that she has been smoking cigarettes. She has a 3.50 pack-year smoking history. She has never used smokeless tobacco. She reports that she drinks alcohol. She reports that she does not use drugs.  Allergies:  Allergies  Allergen Reactions  . Tramadol   . Penicillins Rash    Medications reviewed.    ROS Full ROS performed and is otherwise negative other than what is stated in HPI   BP (!) 132/100   Pulse 89   Temp 97.7 F (36.5 C) (Temporal)   Resp 18   Ht 5' 5"  (1.651 m)   Wt 88.9 kg   SpO2 97%   BMI 32.62 kg/m   Physical Exam  Constitutional: She is oriented to person, place, and time. She appears well-developed and well-nourished. No distress.  Neck: Normal range of motion. Neck supple. No JVD present. No thyromegaly present.  Cardiovascular: Normal  rate, regular rhythm and normal heart sounds.  Pulmonary/Chest: Effort normal and breath sounds normal. No stridor. No respiratory distress. She has no wheezes.  Abdominal: Soft. She exhibits no distension and no mass. There is no tenderness. There is no rebound and no guarding.  Genitourinary:  Genitourinary Comments: Pilonidal disease w some pits, no evidence of abscess  Musculoskeletal: Normal range of motion. She exhibits no edema.  Neurological: She is alert and oriented to person, place, and time. She displays normal reflexes. No cranial nerve deficit. She exhibits normal muscle tone. Coordination normal.  Skin: Skin is warm and dry. Capillary refill takes less than 2 seconds. She is not diaphoretic.  Psychiatric: She has a normal mood and affect. Her behavior is normal. Judgment and thought content normal.  Nursing note and vitals reviewed.     Assessment/Plan:  27 year old female with recurrent pilonidal disease and abscess.  Now resolved but persistent disease and symptoms.  Discussed with the patient in detail and I have recommended excision in the OR with healing by secondary intention. The patient detail about the procedure.  Risk benefit and possible complications including but not limited to: Bleeding, prolonged wound healing, infection and recurrence of symptoms.  She understands and wishes to proceed.  Greater than 50% of the 25 minutes  visit was spent in counseling/coordination of care  Kim Hamman, MD [facility] General Surgeon

## 2018-02-26 NOTE — Patient Instructions (Addendum)
Please arrive at the [facility] registration desk @ 12:00 pm.   Please see your blue pre-care sheet for surgery information. You have been seen today for a Pilonidal Cyst.  To keep this cyst as minimal as possible, you will want to shave or use Veet (Hair Remover) on this area to keep as much hair out of the tracts as you can, have a family member pull hair from the tracts if possible, keep the area clean and dry as possible. Wash with soap and water once daily and keep a guaze on this area at all times while draining.  If we excise this area (surgery) in the future, you will need to arrange to be out of work for approximately 1-2 weeks and then have a family member change the dressing 1-2 times daily until this heals from the inside out.   Please call our office with any questions or concerns.    Pilonidal Cyst A pilonidal cyst is a fluid-filled sac. It forms beneath the skin near your tailbone, at the top of the crease of your buttocks. A pilonidal cyst that is not large or infected may not cause symptoms or problems. If the cyst becomes irritated or infected, it may fill with pus. This causes pain and swelling (pilonidal abscess). An infected cyst may need to be treated with medicine, drained, or removed. CAUSES The cause of a pilonidal cyst is not known. One cause may be a hair that grows into your skin (ingrown hair). RISK FACTORS Pilonidal cysts are more common in boys and men. Risk factors include:  Having lots of hair near the crease of the buttocks.  Being overweight.  Having a pilonidal dimple.  Wearing tight clothing.  Not bathing or showering frequently.  Sitting for long periods of time. SIGNS AND SYMPTOMS Signs and symptoms of a pilonidal cyst may include:  Redness.  Pain and tenderness.  Warmth.  Swelling.  Pus.  Fever. DIAGNOSIS Your health care provider may diagnose a pilonidal cyst based on your symptoms and a physical exam. The health care provider  may do a blood test to check for infection. If your cyst is draining pus, your health care provider may take a sample of the drainage to be tested at a laboratory. TREATMENT Surgery is the usual treatment for an infected pilonidal cyst. You may also have to take medicines before surgery. The type of surgery you have depends on the size and severity of the infected cyst. The different kinds of surgery include:  Incision and drainage. This is a procedure to open and drain the cyst.  Marsupialization. In this procedure, a large cyst or abscess may be opened and kept open by stitching the edges of the skin to the cyst walls.  Cyst removal. This procedure involves opening the skin and removing all or part of the cyst. HOME CARE INSTRUCTIONS  Follow all of your surgeon's instructions carefully if you had surgery.  Take medicines only as directed by your health care provider.  If you were prescribed an antibiotic medicine, finish it all even if you start to feel better.  Keep the area around your pilonidal cyst clean and dry.  Clean the area as directed by your health care provider. Pat the area dry with a clean towel. Do not rub it as this may cause bleeding.  Remove hair from the area around the cyst as directed by your health care provider.  Do not wear tight clothing or sit in one place for long periods  of time.  There are many different ways to close and cover an incision, including stitches, skin glue, and adhesive strips. Follow your health care provider's instructions on:  Incision care.  Bandage (dressing) changes and removal.  Incision closure removal. SEEK MEDICAL CARE IF:   You have drainage, redness, swelling, or pain at the site of the cyst.  You have a fever.   This information is not intended to replace advice given to you by your health care provider. Make sure you discuss any questions you have with your health care provider.   Document Released: 05/27/2000 Document  Revised: 06/20/2014 Document Reviewed: 10/17/2013 Elsevier Interactive Patient Education 2016 [employer]      Pilonidal Cyst A pilonidal cyst is a fluid-filled sac. It forms beneath the skin near your tailbone, at the top of the crease of your buttocks. A pilonidal cyst that is not large or infected may not cause symptoms or problems. If the cyst becomes irritated or infected, it may fill with pus. This causes pain and swelling (pilonidal abscess). An infected cyst may need to be treated with medicine, drained, or removed. What are the causes? The cause of a pilonidal cyst is not known. One cause may be a hair that grows into your skin (ingrown hair). What increases the risk? Pilonidal cysts are more common in boys and men. Risk factors include:  Having lots of hair near the crease of the buttocks.  Being overweight.  Having a pilonidal dimple.  Wearing tight clothing.  Not bathing or showering frequently.  Sitting for long periods of time.  What are the signs or symptoms? Signs and symptoms of a pilonidal cyst may include:  Redness.  Pain and tenderness.  Warmth.  Swelling.  Pus.  Fever.  How is this diagnosed? Your health care provider may diagnose a pilonidal cyst based on your symptoms and a physical exam. The health care provider may do a blood test to check for infection. If your cyst is draining pus, your health care provider may take a sample of the drainage to be tested at a laboratory. How is this treated? Surgery is the usual treatment for an infected pilonidal cyst. You may also have to take medicines before surgery. The type of surgery you have depends on the size and severity of the infected cyst. The different kinds of surgery include:  Incision and drainage. This is a procedure to open and drain the cyst.  Marsupialization. In this procedure, a large cyst or abscess may be opened and kept open by stitching the edges of the skin to the cyst  walls.  Cyst removal. This procedure involves opening the skin and removing all or part of the cyst.  Follow these instructions at home:  Follow all of your surgeon's instructions carefully if you had surgery.  Take medicines only as directed by your health care provider.  If you were prescribed an antibiotic medicine, finish it all even if you start to feel better.  Keep the area around your pilonidal cyst clean and dry.  Clean the area as directed by your health care provider. Pat the area dry with a clean towel. Do not rub it as this may cause bleeding.  Remove hair from the area around the cyst as directed by your health care provider.  Do not wear tight clothing or sit in one place for long periods of time.  There are many different ways to close and cover an incision, including stitches, skin glue, and adhesive strips.  Follow your health care provider's instructions on: ? Incision care. ? Bandage (dressing) changes and removal. ? Incision closure removal. Contact a health care provider if:  You have drainage, redness, swelling, or pain at the site of the cyst.  You have a fever. This information is not intended to replace advice given to you by your health care provider. Make sure you discuss any questions you have with your health care provider. Document Released: 05/27/2000 Document Revised: 11/05/2015 Document Reviewed: 10/17/2013 Elsevier Interactive Patient Education  2018 [employer].

## 2018-02-26 NOTE — Telephone Encounter (Signed)
Pt advised of pre op date/time and sx date. Sx: 02/27/18 with Dr Pabon-excision of pilonidal cyst.  Pre op: arrive @ 12:00pm [facility] day [facility].

## 2018-02-26 NOTE — Progress Notes (Signed)
Outpatient Surgical Follow Up  02/26/2018  Kim Crawford is an 27 y.o. female.   Chief Complaint  Patient presents with  . New Patient (Initial Visit)    Pilonidal cyst    HPI: Kim Crawford the is a 27 year old female well-known to me with a history of obesity, tobacco abuse and recurrent pilonidal disease.  She has had actually hospitalized in the past and requiring IV antibiotic from sepsis from her pilonidal disease.  I saw her more than a year ago and unfortunately she lost follow-up. He has decreased smoking and is down to quarter pack a day.  She is able to perform more than 4 METS of activity without any shortness of breath or chest pain.  She recently has been treated with Cipro and Flagyl and completed the antibiotic course.  No pain some minimal scant drainage from the coccyx area  Past Medical History:  Diagnosis Date  . Breast mass, right 2014  . Eczema     Past Surgical History:  Procedure Laterality Date  . BREAST SURGERY Right 2014   breast mass  . CESAREAN SECTION  2011    Family History  Adopted: Yes  Problem Relation Age of Onset  . Parkinson's disease Father     Social History:  reports that she has been smoking cigarettes. She has a 3.50 pack-year smoking history. She has never used smokeless tobacco. She reports that she drinks alcohol. She reports that she does not use drugs.  Allergies:  Allergies  Allergen Reactions  . Tramadol   . Penicillins Rash    Medications reviewed.    ROS Full ROS performed and is otherwise negative other than what is stated in HPI   BP (!) 132/100   Pulse 89   Temp 97.7 F (36.5 C) (Temporal)   Resp 18   Ht 5' 5"  (1.651 m)   Wt 88.9 kg   SpO2 97%   BMI 32.62 kg/m   Physical Exam  Constitutional: She is oriented to person, place, and time. She appears well-developed and well-nourished. No distress.  Neck: Normal range of motion. Neck supple. No JVD present. No thyromegaly present.  Cardiovascular: Normal  rate, regular rhythm and normal heart sounds.  Pulmonary/Chest: Effort normal and breath sounds normal. No stridor. No respiratory distress. She has no wheezes.  Abdominal: Soft. She exhibits no distension and no mass. There is no tenderness. There is no rebound and no guarding.  Genitourinary:  Genitourinary Comments: Pilonidal disease w some pits, no evidence of abscess  Musculoskeletal: Normal range of motion. She exhibits no edema.  Neurological: She is alert and oriented to person, place, and time. She displays normal reflexes. No cranial nerve deficit. She exhibits normal muscle tone. Coordination normal.  Skin: Skin is warm and dry. Capillary refill takes less than 2 seconds. She is not diaphoretic.  Psychiatric: She has a normal mood and affect. Her behavior is normal. Judgment and thought content normal.  Nursing note and vitals reviewed.     Assessment/Plan:  27 year old female with recurrent pilonidal disease and abscess.  Now resolved but persistent disease and symptoms.  Discussed with the patient in detail and I have recommended excision in the OR with healing by secondary intention. The patient detail about the procedure.  Risk benefit and possible complications including but not limited to: Bleeding, prolonged wound healing, infection and recurrence of symptoms.  She understands and wishes to proceed.  Greater than 50% of the 25 minutes  visit was spent in counseling/coordination of care  Caroleen Hamman, MD [facility] General Surgeon

## 2018-02-27 ENCOUNTER — Encounter
Admission: RE | Disposition: A | Discharge status: Home / Self Care | Payer: Self-pay | Source: Ambulatory Visit | Attending: Surgery

## 2018-02-27 ENCOUNTER — Ambulatory Visit
Admission: RE | Admit: 2018-02-27 | Discharge: 2018-02-27 | Disposition: A | Discharge status: Home / Self Care | Payer: Medicaid Other | Source: Ambulatory Visit | Attending: Surgery | Admitting: Surgery

## 2018-02-27 ENCOUNTER — Ambulatory Visit: Payer: Medicaid Other | Admitting: Anesthesiology

## 2018-02-27 ENCOUNTER — Other Ambulatory Visit: Payer: Self-pay

## 2018-02-27 ENCOUNTER — Encounter: Payer: Self-pay | Admitting: *Deleted

## 2018-02-27 DIAGNOSIS — L0501 Pilonidal cyst with abscess: Secondary | ICD-10-CM | POA: Diagnosis not present

## 2018-02-27 DIAGNOSIS — F17210 Nicotine dependence, cigarettes, uncomplicated: Secondary | ICD-10-CM | POA: Insufficient documentation

## 2018-02-27 DIAGNOSIS — E669 Obesity, unspecified: Secondary | ICD-10-CM | POA: Insufficient documentation

## 2018-02-27 DIAGNOSIS — L0591 Pilonidal cyst without abscess: Secondary | ICD-10-CM | POA: Diagnosis not present

## 2018-02-27 DIAGNOSIS — Z6832 Body mass index (BMI) 32.0-32.9, adult: Secondary | ICD-10-CM | POA: Diagnosis not present

## 2018-02-27 DIAGNOSIS — Z888 Allergy status to other drugs, medicaments and biological substances status: Secondary | ICD-10-CM | POA: Diagnosis not present

## 2018-02-27 HISTORY — PX: PILONIDAL CYST EXCISION: SHX744

## 2018-02-27 LAB — POCT PREGNANCY, URINE: Preg Test, Ur: NEGATIVE

## 2018-02-27 SURGERY — EXCISION, SIMPLE PILONIDAL CYST
Anesthesia: General | Site: Buttocks

## 2018-02-27 MED ORDER — ONDANSETRON HCL 4 MG/2ML IJ SOLN: 4.0000 mg | Freq: Once | INTRAMUSCULAR | Status: DC | PRN

## 2018-02-27 MED ORDER — ONDANSETRON HCL 4 MG/2ML IJ SOLN
INTRAMUSCULAR | Status: DC | PRN
  Administered 2018-02-27: 4 mg via INTRAVENOUS

## 2018-02-27 MED ORDER — FENTANYL CITRATE (PF) 100 MCG/2ML IJ SOLN
INTRAMUSCULAR | Status: AC
  Administered 2018-02-27: 25 ug via INTRAVENOUS
  Filled 2018-02-27: qty 2

## 2018-02-27 MED ORDER — CHLORHEXIDINE GLUCONATE CLOTH 2 % EX PADS
6.0000 | MEDICATED_PAD | Freq: Once | CUTANEOUS | Status: AC
  Administered 2018-02-27: 6 via TOPICAL

## 2018-02-27 MED ORDER — HYDROCODONE-ACETAMINOPHEN 5-325 MG PO TABS
1.0000 | ORAL_TABLET | ORAL | Status: DC | PRN
  Administered 2018-02-27: 1 via ORAL

## 2018-02-27 MED ORDER — GABAPENTIN 300 MG PO CAPS
300.0000 mg | ORAL_CAPSULE | ORAL | Status: AC
  Administered 2018-02-27: 300 mg via ORAL

## 2018-02-27 MED ORDER — PROPOFOL 10 MG/ML IV BOLUS
INTRAVENOUS | Status: AC
  Filled 2018-02-27: qty 20

## 2018-02-27 MED ORDER — MIDAZOLAM HCL 2 MG/2ML IJ SOLN
INTRAMUSCULAR | Status: AC
  Filled 2018-02-27: qty 2

## 2018-02-27 MED ORDER — SUCCINYLCHOLINE CHLORIDE 20 MG/ML IJ SOLN
INTRAMUSCULAR | Status: AC
  Filled 2018-02-27: qty 1

## 2018-02-27 MED ORDER — CLINDAMYCIN PHOSPHATE 900 MG/50ML IV SOLN
INTRAVENOUS | Status: AC
  Filled 2018-02-27: qty 50

## 2018-02-27 MED ORDER — BUPIVACAINE-EPINEPHRINE (PF) 0.25% -1:200000 IJ SOLN
INTRAMUSCULAR | Status: AC
  Filled 2018-02-27: qty 30

## 2018-02-27 MED ORDER — ACETAMINOPHEN 500 MG PO TABS
ORAL_TABLET | ORAL | Status: AC
  Filled 2018-02-27: qty 2

## 2018-02-27 MED ORDER — CELECOXIB 200 MG PO CAPS
ORAL_CAPSULE | ORAL | Status: AC
  Filled 2018-02-27: qty 1

## 2018-02-27 MED ORDER — HYDROCODONE-ACETAMINOPHEN 5-325 MG PO TABS: 1.0000 | ORAL_TABLET | ORAL | 0 refills | Status: DC | PRN

## 2018-02-27 MED ORDER — BUPIVACAINE LIPOSOME 1.3 % IJ SUSP: 20.0000 mL | Freq: Once | INTRAMUSCULAR | Status: DC

## 2018-02-27 MED ORDER — FENTANYL CITRATE (PF) 100 MCG/2ML IJ SOLN
INTRAMUSCULAR | Status: DC | PRN
  Administered 2018-02-27: 100 ug via INTRAVENOUS

## 2018-02-27 MED ORDER — FAMOTIDINE 20 MG PO TABS
20.0000 mg | ORAL_TABLET | Freq: Once | ORAL | Status: AC
  Administered 2018-02-27: 20 mg via ORAL

## 2018-02-27 MED ORDER — FENTANYL CITRATE (PF) 100 MCG/2ML IJ SOLN
25.0000 ug | INTRAMUSCULAR | Status: DC | PRN
  Administered 2018-02-27 (×4): 25 ug via INTRAVENOUS

## 2018-02-27 MED ORDER — PROPOFOL 10 MG/ML IV BOLUS
INTRAVENOUS | Status: DC | PRN
  Administered 2018-02-27: 200 mg via INTRAVENOUS

## 2018-02-27 MED ORDER — ACETAMINOPHEN 500 MG PO TABS
1000.0000 mg | ORAL_TABLET | ORAL | Status: AC
  Administered 2018-02-27: 1000 mg via ORAL

## 2018-02-27 MED ORDER — GABAPENTIN 300 MG PO CAPS
ORAL_CAPSULE | ORAL | Status: AC
  Filled 2018-02-27: qty 1

## 2018-02-27 MED ORDER — LIDOCAINE HCL (CARDIAC) PF 100 MG/5ML IV SOSY
PREFILLED_SYRINGE | INTRAVENOUS | Status: DC | PRN
  Administered 2018-02-27: 100 mg via INTRAVENOUS

## 2018-02-27 MED ORDER — CHLORHEXIDINE GLUCONATE CLOTH 2 % EX PADS: 6.0000 | MEDICATED_PAD | Freq: Once | CUTANEOUS | Status: DC

## 2018-02-27 MED ORDER — FAMOTIDINE 20 MG PO TABS
ORAL_TABLET | ORAL | Status: AC
  Filled 2018-02-27: qty 1

## 2018-02-27 MED ORDER — DEXAMETHASONE SODIUM PHOSPHATE 10 MG/ML IJ SOLN
INTRAMUSCULAR | Status: AC
  Filled 2018-02-27: qty 1

## 2018-02-27 MED ORDER — LACTATED RINGERS IV SOLN
INTRAVENOUS | Status: DC
  Administered 2018-02-27: 13:00:00 via INTRAVENOUS

## 2018-02-27 MED ORDER — DEXAMETHASONE SODIUM PHOSPHATE 10 MG/ML IJ SOLN
INTRAMUSCULAR | Status: DC | PRN
  Administered 2018-02-27: 5 mg via INTRAVENOUS

## 2018-02-27 MED ORDER — ONDANSETRON HCL 4 MG/2ML IJ SOLN
INTRAMUSCULAR | Status: AC
  Filled 2018-02-27: qty 2

## 2018-02-27 MED ORDER — FENTANYL CITRATE (PF) 100 MCG/2ML IJ SOLN
INTRAMUSCULAR | Status: AC
  Filled 2018-02-27: qty 2

## 2018-02-27 MED ORDER — ROCURONIUM BROMIDE 100 MG/10ML IV SOLN
INTRAVENOUS | Status: DC | PRN
  Administered 2018-02-27: 20 mg via INTRAVENOUS
  Administered 2018-02-27: 30 mg via INTRAVENOUS

## 2018-02-27 MED ORDER — SUGAMMADEX SODIUM 200 MG/2ML IV SOLN
INTRAVENOUS | Status: DC | PRN
  Administered 2018-02-27: 200 mg via INTRAVENOUS

## 2018-02-27 MED ORDER — CELECOXIB 200 MG PO CAPS
200.0000 mg | ORAL_CAPSULE | ORAL | Status: AC
  Administered 2018-02-27: 200 mg via ORAL

## 2018-02-27 MED ORDER — ROCURONIUM BROMIDE 50 MG/5ML IV SOLN
INTRAVENOUS | Status: AC
  Filled 2018-02-27: qty 1

## 2018-02-27 MED ORDER — HYDROCODONE-ACETAMINOPHEN 5-325 MG PO TABS
ORAL_TABLET | ORAL | Status: AC
  Filled 2018-02-27: qty 1

## 2018-02-27 MED ORDER — MIDAZOLAM HCL 5 MG/5ML IJ SOLN
INTRAMUSCULAR | Status: DC | PRN
  Administered 2018-02-27: 2 mg via INTRAVENOUS

## 2018-02-27 SURGICAL SUPPLY — 23 items
ADHESIVE MASTISOL STRL (MISCELLANEOUS) ×3 IMPLANT
BLADE CLIPPER SURG (BLADE) ×3 IMPLANT
BLADE SURG 15 STRL LF DISP TIS (BLADE) ×1 IMPLANT
BLADE SURG 15 STRL SS (BLADE) ×2
BNDG GAUZE 1X2.1 STRL (MISCELLANEOUS) ×3 IMPLANT
CANISTER SUCT 1200ML W/VALVE (MISCELLANEOUS) ×3 IMPLANT
CHLORAPREP W/TINT 26ML (MISCELLANEOUS) ×3 IMPLANT
DRAPE LAPAROTOMY 77X122 PED (DRAPES) ×3 IMPLANT
ELECT REM PT RETURN 9FT ADLT (ELECTROSURGICAL) ×3
ELECTRODE REM PT RTRN 9FT ADLT (ELECTROSURGICAL) ×1 IMPLANT
GAUZE SPONGE 4X4 12PLY STRL (GAUZE/BANDAGES/DRESSINGS) ×2 IMPLANT
GAUZE STRETCH 2X75IN STRL (MISCELLANEOUS) ×2 IMPLANT
GLOVE BIO SURGEON STRL SZ7 (GLOVE) ×3 IMPLANT
GOWN STRL REUS W/ TWL LRG LVL3 (GOWN DISPOSABLE) ×2 IMPLANT
GOWN STRL REUS W/TWL LRG LVL3 (GOWN DISPOSABLE) ×4
NEEDLE HYPO 22GX1.5 SAFETY (NEEDLE) ×3 IMPLANT
PACK BASIN MINOR ARMC (MISCELLANEOUS) ×3 IMPLANT
PAD ABD DERMACEA PRESS 5X9 (GAUZE/BANDAGES/DRESSINGS) ×3 IMPLANT
SPONGE LAP 18X18 RF (DISPOSABLE) ×3 IMPLANT
SWAB CULTURE AMIES ANAERIB BLU (MISCELLANEOUS) ×1 IMPLANT
SYR 20CC LL (SYRINGE) IMPLANT
SYR BULB IRRIG 60ML STRL (SYRINGE) ×3 IMPLANT
TAPE CLOTH 3X10 WHT NS LF (GAUZE/BANDAGES/DRESSINGS) ×3 IMPLANT

## 2018-02-27 NOTE — Anesthesia Procedure Notes (Signed)
Procedure Name: Intubation Date/Time: 02/27/2018 5:00 PM Performed by: Aline Brochure, CRNA Pre-anesthesia Checklist: Patient identified, Emergency Drugs available, Suction available and Patient being monitored Patient Re-evaluated:Patient Re-evaluated prior to induction Oxygen Delivery Method: Circle system utilized Preoxygenation: Pre-oxygenation with 100% oxygen Induction Type: IV induction Ventilation: Mask ventilation without difficulty Laryngoscope Size: Mac and 3 Grade View: Grade I Tube type: Oral Tube size: 7.0 mm Number of attempts: 1 Airway Equipment and Method: Stylet Placement Confirmation: ETT inserted through vocal cords under direct vision,  positive ETCO2 and breath sounds checked- equal and bilateral Secured at: 21 cm Tube secured with: Tape Dental Injury: Teeth and Oropharynx as per pre-operative assessment

## 2018-02-27 NOTE — Transfer of Care (Signed)
Immediate Anesthesia Transfer of Care Note  Patient: Kim Crawford  Procedure(s) Performed: CYST EXCISION PILONIDAL SIMPLE (N/A Buttocks)  Patient Location: PACU  Anesthesia Type:General  Level of Consciousness: drowsy and patient cooperative  Airway & Oxygen Therapy: Patient Spontanous Breathing and Patient connected to face mask oxygen  Post-op Assessment: Report given to RN and Post -op Vital signs reviewed and stable  Post vital signs: Reviewed and stable  Last Vitals:  Vitals Value Taken Time  BP 130/87 02/27/2018  5:37 PM  Temp    Pulse 101 02/27/2018  5:38 PM  Resp 16 02/27/2018  5:38 PM  SpO2 96 % 02/27/2018  5:38 PM  Vitals shown include unvalidated device data.  Last Pain:  Vitals:   02/27/18 1241  TempSrc: Temporal  PainSc: 0-No pain         Complications: No apparent anesthesia complications

## 2018-02-27 NOTE — Discharge Instructions (Signed)

## 2018-02-27 NOTE — Anesthesia Post-op Follow-up Note (Signed)
Anesthesia QCDR form completed.        

## 2018-02-27 NOTE — Interval H&P Note (Signed)
History and Physical Interval Note:  02/27/2018 4:46 PM  Kim Crawford  has presented today for surgery, with the diagnosis of PILONIDAL CYST  The various methods of treatment have been discussed with the patient and family. After consideration of risks, benefits and other options for treatment, the patient has consented to  Procedure(s): CYST EXCISION PILONIDAL SIMPLE (N/A) as a surgical intervention .  The patient's history has been reviewed, patient examined, no change in status, stable for surgery.  I have reviewed the patient's chart and labs.  Questions were answered to the patient's satisfaction.     [place]

## 2018-02-27 NOTE — Op Note (Signed)
  02/27/2018  5:27 PM  PATIENT:  Kim Crawford  27 y.o. female  PRE-OPERATIVE DIAGNOSIS:  Pilonidal abscess  POST-OPERATIVE DIAGNOSIS:  Same  PROCEDURE:  1. Excision of complex pilonidal disease                            2. Sharp debridement of skin subcutaneous tissue and muscle measuring  square centimeters.    SURGEON:  Surgeon(s) and Role:    * Linna Thebeau F, MD - Primary  ASSISTANTS:   ANESTHESIA: GETA  INDICATIONS FOR PROCEDURE Pilonidal disease DICTATION:  Patient was playing about proceeding detail, risk benefits possible complications and a consent was obtained. The patient taken to the operating room and placed in the prone position. Off midline incision was created  There was complex luculations that we were able to lyse with a combination of finger fracture and suction device. All the loculations were broken down, hair was removed from within the cyst. Using a sharp curette we debrided the sub q tissue down to the muscle to include fascia. Hemostasis was obtained with electrocautery. Irrigation with normal saline and the wound was packed with half-inch packing. Needle and laparotomy counts were correct and there were no immediate complications  Kim Husbands, MD

## 2018-02-27 NOTE — Anesthesia Preprocedure Evaluation (Addendum)
Anesthesia Evaluation  Patient identified by MRN, date of birth, ID band Patient awake    Reviewed: Allergy & Precautions, NPO status , Patient's Chart, lab work & pertinent test results  History of Anesthesia Complications Negative for: history of anesthetic complications  Airway Mallampati: II       Dental   Pulmonary neg sleep apnea, neg COPD, Current Smoker,           Cardiovascular (-) hypertension(-) Past MI and (-) CHF (-) dysrhythmias (-) Valvular Problems/Murmurs     Neuro/Psych neg Seizures    GI/Hepatic Neg liver ROS, neg GERD  ,  Endo/Other  neg diabetes  Renal/GU negative Renal ROS     Musculoskeletal   Abdominal   Peds  Hematology   Anesthesia Other Findings   Reproductive/Obstetrics                            Anesthesia Physical Anesthesia Plan  ASA: II  Anesthesia Plan: General   Post-op Pain Management:    Induction: Intravenous  PONV Risk Score and Plan: 2 and Dexamethasone and Ondansetron  Airway Management Planned: Oral ETT  Additional Equipment:   Intra-op Plan:   Post-operative Plan:   Informed Consent: I have reviewed the patients History and Physical, chart, labs and discussed the procedure including the risks, benefits and alternatives for the proposed anesthesia with the patient or authorized representative who has indicated his/her understanding and acceptance.     Plan Discussed with:   Anesthesia Plan Comments:         Anesthesia Quick Evaluation

## 2018-02-27 NOTE — Interval H&P Note (Signed)
History and Physical Interval Note:  02/27/2018 4:46 PM  Kim Crawford  has presented today for surgery, with the diagnosis of PILONIDAL CYST  The various methods of treatment have been discussed with the patient and family. After consideration of risks, benefits and other options for treatment, the patient has consented to  Procedure(s): CYST EXCISION PILONIDAL SIMPLE (N/A) as a surgical intervention .  The patient's history has been reviewed, patient examined, no change in status, stable for surgery.  I have reviewed the patient's chart and labs.  Questions were answered to the patient's satisfaction.     [place]

## 2018-02-28 ENCOUNTER — Encounter: Payer: Self-pay | Admitting: Surgery

## 2018-02-28 NOTE — Anesthesia Postprocedure Evaluation (Signed)
Anesthesia Post Note  Patient: Kim Crawford  Procedure(s) Performed: CYST EXCISION PILONIDAL SIMPLE (N/A Buttocks)  Patient location during evaluation: PACU Anesthesia Type: General Level of consciousness: awake and alert Pain management: pain level controlled Vital Signs Assessment: post-procedure vital signs reviewed and stable Respiratory status: spontaneous breathing, nonlabored ventilation and respiratory function stable Cardiovascular status: blood pressure returned to baseline and stable Postop Assessment: no apparent nausea or vomiting Anesthetic complications: no     Last Vitals:  Vitals:   02/27/18 1825 02/27/18 1902  BP: 115/66 113/63  Pulse: 76 65  Resp: 16 16  Temp: (!) 36.3 C (!) 36.3 C  SpO2: 93% 95%    Last Pain:  Vitals:   02/27/18 1825  TempSrc: Temporal  PainSc: 8                  Hazely Sealey T Rhemi Balbach

## 2018-03-05 ENCOUNTER — Telehealth: Payer: Self-pay | Admitting: *Deleted

## 2018-03-05 NOTE — Telephone Encounter (Signed)
Patient called stating that she had pilonidal cyst surgery on 02/27/18 by Dr.Pabon and now she is having some pain around the area and at the incision site its draining puss. Please call and advise.

## 2018-03-05 NOTE — Telephone Encounter (Signed)
Patient states she can't come in today to see Dr. Marcene Corning . She will come on Wednesday at 9:30am

## 2018-03-05 NOTE — Telephone Encounter (Signed)
Patient needs to come in today to see Dr. Dahlia Byes

## 2018-03-07 ENCOUNTER — Encounter: Payer: Self-pay | Admitting: Surgery

## 2018-03-07 ENCOUNTER — Ambulatory Visit (INDEPENDENT_AMBULATORY_CARE_PROVIDER_SITE_OTHER): Payer: Medicaid Other | Admitting: Surgery

## 2018-03-07 VITALS — BP 133/90 | HR 73 | Temp 97.7°F | Ht 66.0 in | Wt 193.0 lb

## 2018-03-07 DIAGNOSIS — Z09 Encounter for follow-up examination after completed treatment for conditions other than malignant neoplasm: Secondary | ICD-10-CM

## 2018-03-07 NOTE — Progress Notes (Signed)
S/p pilonidal excision  Doing well Taking ibuprofen Side effect to norco No fevers or chills Doing BID dressings  PE NAD Wound healing well, good granulation tissue, no infection  A/P Doing well Expected wound , it will take at least 2-3 weeks to heal No surgical issues Continue wound care RTC prn

## 2018-03-07 NOTE — Patient Instructions (Addendum)
Return as needed.The patient is aware to call back for any questions or concerns.  

## 2018-03-14 ENCOUNTER — Encounter: Payer: Medicaid Other | Admitting: Surgery

## 2018-05-17 ENCOUNTER — Emergency Department: Payer: Worker's Compensation

## 2018-05-17 ENCOUNTER — Encounter: Payer: Self-pay | Admitting: Emergency Medicine

## 2018-05-17 ENCOUNTER — Emergency Department
Admission: EM | Admit: 2018-05-17 | Discharge: 2018-05-17 | Disposition: A | Discharge status: Home / Self Care | Payer: Worker's Compensation | Attending: Emergency Medicine | Admitting: Emergency Medicine

## 2018-05-17 DIAGNOSIS — Z79899 Other long term (current) drug therapy: Secondary | ICD-10-CM | POA: Diagnosis not present

## 2018-05-17 DIAGNOSIS — Y929 Unspecified place or not applicable: Secondary | ICD-10-CM | POA: Insufficient documentation

## 2018-05-17 DIAGNOSIS — W208XXA Other cause of strike by thrown, projected or falling object, initial encounter: Secondary | ICD-10-CM | POA: Insufficient documentation

## 2018-05-17 DIAGNOSIS — Y9389 Activity, other specified: Secondary | ICD-10-CM | POA: Diagnosis not present

## 2018-05-17 DIAGNOSIS — Y999 Unspecified external cause status: Secondary | ICD-10-CM | POA: Insufficient documentation

## 2018-05-17 DIAGNOSIS — S9031XA Contusion of right foot, initial encounter: Secondary | ICD-10-CM | POA: Diagnosis not present

## 2018-05-17 DIAGNOSIS — F17210 Nicotine dependence, cigarettes, uncomplicated: Secondary | ICD-10-CM | POA: Diagnosis not present

## 2018-05-17 DIAGNOSIS — S99921A Unspecified injury of right foot, initial encounter: Secondary | ICD-10-CM | POA: Diagnosis present

## 2018-05-17 MED ORDER — MELOXICAM 15 MG PO TABS: 15.0000 mg | ORAL_TABLET | Freq: Every day | ORAL | 1 refills | Status: AC

## 2018-05-17 NOTE — ED Provider Notes (Signed)
[facility] Emergency Department Provider Note  ____________________________________________  Time seen: Approximately 6:08 PM  I have reviewed the triage vital signs and the nursing notes.   HISTORY  Chief Complaint Foot Injury    HPI Kim Crawford is a 27 y.o. female presents to the emergency department with 7/10 aching right foot pain after patient dropped a metal picnic table on her foot. No numbness or tingling. Patient localizes pain over the first metatarsal. No lacerations or abrasions. No alleviating measures have been attempted.    Past Medical History:  Diagnosis Date  . Breast mass, right 2014  . Eczema     Patient Active Problem List   Diagnosis Date Noted  . Pilonidal cyst   . Sepsis ([place]) 01/03/2018  . Cellulitis 01/03/2018    Past Surgical History:  Procedure Laterality Date  . BREAST SURGERY Right 2014   breast mass  . CESAREAN SECTION  2011  . PILONIDAL CYST EXCISION N/A 02/27/2018   Procedure: CYST EXCISION PILONIDAL SIMPLE;  Surgeon: Jules Husbands, MD;  Location: [facility] ORS;  Service: General;  Laterality: N/A;    Prior to Admission medications   Medication Sig Start Date End Date Taking? Authorizing Provider  HYDROcodone-acetaminophen (NORCO/VICODIN) 5-325 MG tablet Take 1-2 tablets by mouth every 4 (four) hours as needed for moderate pain. 02/27/18   Pabon, Diego F, MD  meloxicam (MOBIC) 15 MG tablet Take 1 tablet (15 mg total) by mouth daily for 7 days. 05/17/18 05/24/18  Lannie Fields, PA-C    Allergies Toradol [ketorolac tromethamine]; Tramadol; and Penicillins  Family History  Adopted: Yes  Problem Relation Age of Onset  . Hypertension Mother   . Parkinson's disease Father     Social History Social History   Tobacco Use  . Smoking status: Current Every Day Smoker    Packs/day: 0.25    Years: 7.00    Pack years: 1.75    Types: Cigarettes  . Smokeless tobacco: Never Used  Substance Use Topics  .  Alcohol use: Yes    Comment: occasionally  . Drug use: No     Review of Systems  Constitutional: No fever/chills Eyes: No visual changes. No discharge ENT: No upper respiratory complaints. Cardiovascular: no chest pain. Respiratory: no cough. No SOB. Gastrointestinal: No abdominal pain.  No nausea, no vomiting.  No diarrhea.  No constipation. Musculoskeletal: Patient has right foot pain.  Skin: Negative for rash, abrasions, lacerations, ecchymosis. Neurological: Negative for headaches, focal weakness or numbness.   ____________________________________________   PHYSICAL EXAM:  VITAL SIGNS: ED Triage Vitals  Enc Vitals Group     BP 05/17/18 1510 135/84     Pulse Rate 05/17/18 1510 100     Resp --      Temp 05/17/18 1510 98.2 F (36.8 C)     Temp Source 05/17/18 1510 Oral     SpO2 05/17/18 1510 99 %     Weight 05/17/18 1458 165 lb (74.8 kg)     Height 05/17/18 1458 5' 5"  (1.651 m)     Head Circumference --      Peak Flow --      Pain Score 05/17/18 1458 8     Pain Loc --      Pain Edu? --      Excl. in [place]? --      Constitutional: Alert and oriented. Well appearing and in no acute distress. Eyes: Conjunctivae are normal. PERRL. EOMI. Head: Atraumatic. Cardiovascular: Normal rate, regular rhythm. Normal S1  and S2.  Good peripheral circulation. Respiratory: Normal respiratory effort without tachypnea or retractions. Lungs CTAB. Good air entry to the bases with no decreased or absent breath sounds. Musculoskeletal: Patient has pain with palpation over the first metatarsal.  She is able to perform full range of motion at the right ankle.  Palpable dorsalis pedis pulse, right. Neurologic:  Normal speech and language. No gross focal neurologic deficits are appreciated.  Skin:  Skin is warm, dry and intact. No rash noted. Psychiatric: Mood and affect are normal. Speech and behavior are normal. Patient exhibits appropriate insight and  judgement.   ____________________________________________   LABS (all labs ordered are listed, but only abnormal results are displayed)  Labs Reviewed - No data to display ____________________________________________  EKG   ____________________________________________  RADIOLOGY I personally viewed and evaluated these images as part of my medical decision making, as well as reviewing the written report by the radiologist  Dg Foot Complete Right  Result Date: 05/17/2018 CLINICAL DATA:  Right foot pain. Dropped metal table on top of foot. EXAM: RIGHT FOOT COMPLETE - 3+ VIEW COMPARISON:  None. FINDINGS: There is no evidence of fracture or dislocation. There is no evidence of arthropathy or other focal bone abnormality. Soft tissues are unremarkable. IMPRESSION: Negative. Electronically Signed   By: Markus Daft M.D.   On: 05/17/2018 15:59    ____________________________________________    PROCEDURES  Procedure(s) performed:    Procedures    Medications - No data to display   ____________________________________________   INITIAL IMPRESSION / ASSESSMENT AND PLAN / ED COURSE  Pertinent labs & imaging results that were available during my care of the patient were reviewed by me and considered in my medical decision making (see chart for details).  Review of the [place] CSRS was performed in accordance of the [place] prior to dispensing any controlled drugs.      Assessment and plan Right foot contusion Patient presents to the emergency department with right foot pain after patient dropped a metal picnic table on her foot.  X-ray examination revealed no acute fracture or bony abnormality. Ice was recommended.  Patient was discharged with meloxicam and advised to follow-up with primary care as needed.  All patient questions were answered.    ____________________________________________  FINAL CLINICAL IMPRESSION(S) / ED DIAGNOSES  Final diagnoses:  Contusion of right foot,  initial encounter      NEW MEDICATIONS STARTED DURING THIS VISIT:  ED Discharge Orders         Ordered    meloxicam (MOBIC) 15 MG tablet  Daily     05/17/18 1628              This chart was dictated using voice recognition software/Dragon. Despite best efforts to proofread, errors can occur which can change the meaning. Any change was purely unintentional.    Lannie Fields, PA-C 05/17/18 2152    Rudene Re, MD 05/17/18 [phone]

## 2018-05-17 NOTE — ED Notes (Signed)
Dorsalis pedis pulses equal. R foot appropriate color. Can see pink mark where table hit foot.

## 2018-05-17 NOTE — ED Triage Notes (Signed)
Pt reports was at work and a big metal table fell on her right foot. Pt unable to put weight on it.

## 2018-05-17 NOTE — ED Notes (Signed)
Pt given d/c paperwork, prescription, and work note.

## 2018-08-04 ENCOUNTER — Emergency Department: Payer: Medicaid Other

## 2018-08-04 ENCOUNTER — Encounter: Payer: Self-pay | Admitting: Emergency Medicine

## 2018-08-04 ENCOUNTER — Emergency Department
Admission: EM | Admit: 2018-08-04 | Discharge: 2018-08-04 | Disposition: A | Discharge status: Home / Self Care | Payer: Medicaid Other | Attending: Emergency Medicine | Admitting: Emergency Medicine

## 2018-08-04 ENCOUNTER — Other Ambulatory Visit: Payer: Self-pay

## 2018-08-04 DIAGNOSIS — F17210 Nicotine dependence, cigarettes, uncomplicated: Secondary | ICD-10-CM | POA: Insufficient documentation

## 2018-08-04 DIAGNOSIS — J069 Acute upper respiratory infection, unspecified: Secondary | ICD-10-CM

## 2018-08-04 DIAGNOSIS — R509 Fever, unspecified: Secondary | ICD-10-CM | POA: Diagnosis present

## 2018-08-04 MED ORDER — IPRATROPIUM-ALBUTEROL 0.5-2.5 (3) MG/3ML IN SOLN
3.0000 mL | Freq: Once | RESPIRATORY_TRACT | Status: AC
  Administered 2018-08-04: 3 mL via RESPIRATORY_TRACT
  Filled 2018-08-04: qty 3

## 2018-08-04 MED ORDER — AZITHROMYCIN 250 MG PO TABS: ORAL_TABLET | ORAL | 0 refills | Status: DC

## 2018-08-04 MED ORDER — ALBUTEROL SULFATE HFA 108 (90 BASE) MCG/ACT IN AERS: 2.0000 | INHALATION_SPRAY | Freq: Four times a day (QID) | RESPIRATORY_TRACT | 0 refills | Status: DC | PRN

## 2018-08-04 MED ORDER — AZITHROMYCIN 500 MG PO TABS
500.0000 mg | ORAL_TABLET | Freq: Once | ORAL | Status: AC
  Administered 2018-08-04: 500 mg via ORAL
  Filled 2018-08-04: qty 1

## 2018-08-04 NOTE — ED Triage Notes (Signed)
C/O fever x 2 days.  Sinus congestion noted.  NAD.  Last medicated with Alka Seltzer cold about 1 hour PTA

## 2018-08-04 NOTE — ED Provider Notes (Signed)
[facility] Emergency Department Provider Note  ____________________________________________  Time seen: Approximately 1:28 PM  I have reviewed the triage vital signs and the nursing notes.   HISTORY  Chief Complaint Fever    HPI Kim Crawford is a 28 y.o. female that presents to the emergency department for evaluation of fever, nasal congestion, cough for 2 days.  Patient states that fever has been as high as 101.8. Cough was productive yesterday with sputum. She smokes a pack of cigarettes every 2 to 3 days.  She does not feel like she has the flu.  She has had 2 episodes of diarrhea.  No sick contacts.  She took Alka-Seltzer plus before arrival.  Other than the cough, she feels well. No shortness of breath, chest pain, nausea, vomiting, abdominal pain.   Past Medical History:  Diagnosis Date  . Breast mass, right 2014  . Eczema     Patient Active Problem List   Diagnosis Date Noted  . Pilonidal cyst   . Sepsis ([place]) 01/03/2018  . Cellulitis 01/03/2018    Past Surgical History:  Procedure Laterality Date  . BREAST SURGERY Right 2014   breast mass  . CESAREAN SECTION  2011  . PILONIDAL CYST EXCISION N/A 02/27/2018   Procedure: CYST EXCISION PILONIDAL SIMPLE;  Surgeon: Jules Husbands, MD;  Location: [facility] ORS;  Service: General;  Laterality: N/A;    Prior to Admission medications   Medication Sig Start Date End Date Taking? Authorizing Provider  albuterol (PROVENTIL HFA;VENTOLIN HFA) 108 (90 Base) MCG/ACT inhaler Inhale 2 puffs into the lungs every 6 (six) hours as needed for wheezing or shortness of breath. 08/04/18   Laban Emperor, PA-C  azithromycin (ZITHROMAX Z-PAK) 250 MG tablet Take 2 tablets (500 mg) on  Day 1,  followed by 1 tablet (250 mg) once daily on Days 2 through 5. 08/04/18   Laban Emperor, PA-C  HYDROcodone-acetaminophen (NORCO/VICODIN) 5-325 MG tablet Take 1-2 tablets by mouth every 4 (four) hours as needed for moderate pain.  02/27/18   Jules Husbands, MD    Allergies Toradol [ketorolac tromethamine]; Tramadol; and Penicillins  Family History  Adopted: Yes  Problem Relation Age of Onset  . Hypertension Mother   . Parkinson's disease Father     Social History Social History   Tobacco Use  . Smoking status: Current Every Day Smoker    Packs/day: 0.25    Years: 7.00    Pack years: 1.75    Types: Cigarettes  . Smokeless tobacco: Never Used  Substance Use Topics  . Alcohol use: Yes    Comment: occasionally  . Drug use: No     Review of Systems  Constitutional: Positive for fever. ENT: Positive for nasal congestion. Cardiovascular: No chest pain. Respiratory: Positive for cough.  No SOB. Gastrointestinal: No abdominal pain.  No nausea, no vomiting.  Musculoskeletal: Negative for musculoskeletal pain. Skin: Negative for rash, abrasions, lacerations, ecchymosis. Neurological: Negative for headaches, numbness or tingling   ____________________________________________   PHYSICAL EXAM:  VITAL SIGNS: ED Triage Vitals  Enc Vitals Group     BP 08/04/18 1240 130/77     Pulse Rate 08/04/18 1240 (!) 107     Resp 08/04/18 1240 20     Temp 08/04/18 1240 98.5 F (36.9 C)     Temp Source 08/04/18 1240 Oral     SpO2 08/04/18 1240 95 %     Weight 08/04/18 1214 164 lb 14.5 oz (74.8 kg)     Height --  Head Circumference --      Peak Flow --      Pain Score 08/04/18 1214 9     Pain Loc --      Pain Edu? --      Excl. in [place]? --      Constitutional: Alert and oriented. Well appearing and in no acute distress. Eyes: Conjunctivae are normal. PERRL. EOMI. Head: Atraumatic. ENT:      Ears: Tympanic membranes pearly.      Nose: Mild congestion/rhinnorhea.      Mouth/Throat: Mucous membranes are moist.  Oropharynx nonerythematous.  Tonsils not enlarged. Neck: No stridor.   Cardiovascular: Normal rate, regular rhythm.  Good peripheral circulation. Respiratory: Normal respiratory effort without  tachypnea or retractions. Lungs CTAB. Good air entry to the bases with no decreased or absent breath sounds. Gastrointestinal: Bowel sounds 4 quadrants. Soft and nontender to palpation. No guarding or rigidity. No palpable masses. No distention. Musculoskeletal: Full range of motion to all extremities. No gross deformities appreciated. Neurologic:  Normal speech and language. No gross focal neurologic deficits are appreciated.  Skin:  Skin is warm, dry and intact. No rash noted. Psychiatric: Mood and affect are normal. Speech and behavior are normal. Patient exhibits appropriate insight and judgement.   ____________________________________________   LABS (all labs ordered are listed, but only abnormal results are displayed)  Labs Reviewed - No data to display ____________________________________________  EKG   ____________________________________________  RADIOLOGY Robinette Haines, personally viewed and evaluated these images (plain radiographs) as part of my medical decision making, as well as reviewing the written report by the radiologist.  Dg Chest 2 View  Result Date: 08/04/2018 CLINICAL DATA:  Cough and fever for 3 days EXAM: CHEST - 2 VIEW COMPARISON:  11/14/2016 FINDINGS: Normal heart size. Lungs are under aerated with bibasilar atelectasis. No pneumothorax. No pleural effusion. IMPRESSION: Bibasilar atelectasis. Electronically Signed   By: Marybelle Killings M.D.   On: 08/04/2018 13:57    ____________________________________________    PROCEDURES  Procedure(s) performed:    Procedures    Medications  azithromycin (ZITHROMAX) tablet 500 mg (500 mg Oral Given 08/04/18 1454)  ipratropium-albuterol (DUONEB) 0.5-2.5 (3) MG/3ML nebulizer solution 3 mL (3 mLs Nebulization Given 08/04/18 1455)     ____________________________________________   INITIAL IMPRESSION / ASSESSMENT AND PLAN / ED COURSE  Pertinent labs & imaging results that were available during my care of  the patient were reviewed by me and considered in my medical decision making (see chart for details).  Review of the [place] CSRS was performed in accordance of the [place] prior to dispensing any controlled drugs.   Patient's diagnosis is consistent with URI.  Vital signs and exam are reassuring.  Chest x-ray consistent with bibasilar atelectasis per radiology. She will be covered for pneumonia. She denies any shortness of breath, chest pain.  She states that she feels well overall.  Patient is eating [employer].  Patient will be discharged home with prescriptions for azithromycin and albuterol. Patient is to follow up with primary care as directed. Patient is given ED precautions to return to the ED for any worsening or new symptoms.     ____________________________________________  FINAL CLINICAL IMPRESSION(S) / ED DIAGNOSES  Final diagnoses:  Upper respiratory tract infection, unspecified type      NEW MEDICATIONS STARTED DURING THIS VISIT:  ED Discharge Orders         Ordered    azithromycin (ZITHROMAX Z-PAK) 250 MG tablet     08/04/18 1502  albuterol (PROVENTIL HFA;VENTOLIN HFA) 108 (90 Base) MCG/ACT inhaler  Every 6 hours PRN     08/04/18 1502              This chart was dictated using voice recognition software/Dragon. Despite best efforts to proofread, errors can occur which can change the meaning. Any change was purely unintentional.    Laban Emperor, PA-C 08/04/18 1537    Harvest Dark, MD 08/04/18 [phone]

## 2018-08-04 NOTE — ED Notes (Signed)
Pt eating [employer]. NAD.

## 2018-09-16 ENCOUNTER — Encounter: Payer: Self-pay | Admitting: Emergency Medicine

## 2018-09-16 ENCOUNTER — Emergency Department
Admission: EM | Admit: 2018-09-16 | Discharge: 2018-09-16 | Disposition: A | Discharge status: Home / Self Care | Payer: Medicaid Other | Attending: Emergency Medicine | Admitting: Emergency Medicine

## 2018-09-16 ENCOUNTER — Emergency Department: Payer: Medicaid Other

## 2018-09-16 ENCOUNTER — Other Ambulatory Visit: Payer: Self-pay

## 2018-09-16 DIAGNOSIS — Y929 Unspecified place or not applicable: Secondary | ICD-10-CM | POA: Diagnosis not present

## 2018-09-16 DIAGNOSIS — S6992XA Unspecified injury of left wrist, hand and finger(s), initial encounter: Secondary | ICD-10-CM | POA: Diagnosis present

## 2018-09-16 DIAGNOSIS — S52502A Unspecified fracture of the lower end of left radius, initial encounter for closed fracture: Secondary | ICD-10-CM | POA: Diagnosis not present

## 2018-09-16 DIAGNOSIS — F17210 Nicotine dependence, cigarettes, uncomplicated: Secondary | ICD-10-CM | POA: Diagnosis not present

## 2018-09-16 DIAGNOSIS — Y939 Activity, unspecified: Secondary | ICD-10-CM | POA: Insufficient documentation

## 2018-09-16 DIAGNOSIS — Y999 Unspecified external cause status: Secondary | ICD-10-CM | POA: Diagnosis not present

## 2018-09-16 MED ORDER — MIDAZOLAM HCL 2 MG/2ML IJ SOLN
INTRAMUSCULAR | Status: AC
  Filled 2018-09-16: qty 6

## 2018-09-16 MED ORDER — OXYCODONE-ACETAMINOPHEN 5-325 MG PO TABS: 1.0000 | ORAL_TABLET | Freq: Four times a day (QID) | ORAL | 0 refills | Status: AC | PRN

## 2018-09-16 MED ORDER — FENTANYL CITRATE (PF) 100 MCG/2ML IJ SOLN
INTRAMUSCULAR | Status: AC
  Filled 2018-09-16: qty 4

## 2018-09-16 MED ORDER — FENTANYL CITRATE (PF) 100 MCG/2ML IJ SOLN
INTRAMUSCULAR | Status: AC | PRN
  Administered 2018-09-16 (×2): 50 ug via INTRAVENOUS

## 2018-09-16 MED ORDER — ONDANSETRON HCL 4 MG/2ML IJ SOLN
4.0000 mg | Freq: Once | INTRAMUSCULAR | Status: AC
  Administered 2018-09-16: 4 mg via INTRAVENOUS
  Filled 2018-09-16: qty 2

## 2018-09-16 MED ORDER — MORPHINE SULFATE (PF) 4 MG/ML IV SOLN
4.0000 mg | Freq: Once | INTRAVENOUS | Status: AC
  Administered 2018-09-16: 17:00:00 4 mg via INTRAVENOUS
  Filled 2018-09-16: qty 1

## 2018-09-16 MED ORDER — OXYCODONE-ACETAMINOPHEN 5-325 MG PO TABS
1.0000 | ORAL_TABLET | Freq: Once | ORAL | Status: AC
  Administered 2018-09-16: 1 via ORAL
  Filled 2018-09-16: qty 1

## 2018-09-16 MED ORDER — MIDAZOLAM HCL 5 MG/5ML IJ SOLN
INTRAMUSCULAR | Status: AC | PRN
  Administered 2018-09-16 (×2): 2 mg via INTRAVENOUS

## 2018-09-16 NOTE — Discharge Instructions (Addendum)
Keep the splint on at all times until you follow-up.  Call Dr. Laurena Spies office tomorrow to arrange for follow-up within the next several days.  Return to the ER for new or worsening pain, swelling, numbness, or any other new or worsening symptoms that concern you.
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
## 2019-05-26 ENCOUNTER — Other Ambulatory Visit: Payer: Self-pay

## 2019-05-26 ENCOUNTER — Inpatient Hospital Stay
Admission: EM | Admit: 2019-05-26 | Discharge: 2019-05-30 | DRG: 392 | Disposition: A | Discharge status: Home / Self Care | Payer: Medicaid Other | Attending: Internal Medicine | Admitting: Internal Medicine

## 2019-05-26 ENCOUNTER — Emergency Department: Payer: Medicaid Other

## 2019-05-26 ENCOUNTER — Encounter: Payer: Self-pay | Admitting: Emergency Medicine

## 2019-05-26 DIAGNOSIS — Z8249 Family history of ischemic heart disease and other diseases of the circulatory system: Secondary | ICD-10-CM

## 2019-05-26 DIAGNOSIS — Z20828 Contact with and (suspected) exposure to other viral communicable diseases: Secondary | ICD-10-CM | POA: Diagnosis present

## 2019-05-26 DIAGNOSIS — Z820 Family history of epilepsy and other diseases of the nervous system: Secondary | ICD-10-CM | POA: Diagnosis not present

## 2019-05-26 DIAGNOSIS — E1110 Type 2 diabetes mellitus with ketoacidosis without coma: Secondary | ICD-10-CM | POA: Diagnosis not present

## 2019-05-26 DIAGNOSIS — K529 Noninfective gastroenteritis and colitis, unspecified: Secondary | ICD-10-CM | POA: Diagnosis not present

## 2019-05-26 DIAGNOSIS — E119 Type 2 diabetes mellitus without complications: Secondary | ICD-10-CM | POA: Diagnosis present

## 2019-05-26 DIAGNOSIS — Z880 Allergy status to penicillin: Secondary | ICD-10-CM | POA: Diagnosis not present

## 2019-05-26 DIAGNOSIS — K5000 Crohn's disease of small intestine without complications: Secondary | ICD-10-CM | POA: Diagnosis present

## 2019-05-26 DIAGNOSIS — Z885 Allergy status to narcotic agent status: Secondary | ICD-10-CM | POA: Diagnosis not present

## 2019-05-26 DIAGNOSIS — E282 Polycystic ovarian syndrome: Secondary | ICD-10-CM | POA: Diagnosis present

## 2019-05-26 DIAGNOSIS — Z791 Long term (current) use of non-steroidal anti-inflammatories (NSAID): Secondary | ICD-10-CM | POA: Diagnosis not present

## 2019-05-26 DIAGNOSIS — K589 Irritable bowel syndrome without diarrhea: Principal | ICD-10-CM | POA: Diagnosis present

## 2019-05-26 DIAGNOSIS — Z8719 Personal history of other diseases of the digestive system: Secondary | ICD-10-CM | POA: Diagnosis not present

## 2019-05-26 DIAGNOSIS — F17210 Nicotine dependence, cigarettes, uncomplicated: Secondary | ICD-10-CM | POA: Diagnosis present

## 2019-05-26 HISTORY — DX: Polycystic ovarian syndrome: E28.2

## 2019-05-26 HISTORY — DX: Type 2 diabetes mellitus without complications: E11.9

## 2019-05-26 LAB — COMPREHENSIVE METABOLIC PANEL
ALT: 64 U/L — ABNORMAL HIGH (ref 0–44)
AST: 39 U/L (ref 15–41)
Albumin: 4.6 g/dL (ref 3.5–5.0)
Alkaline Phosphatase: 73 U/L (ref 38–126)
Anion gap: 10 (ref 5–15)
BUN: 11 mg/dL (ref 6–20)
CO2: 20 mmol/L — ABNORMAL LOW (ref 22–32)
Calcium: 9.1 mg/dL (ref 8.9–10.3)
Chloride: 103 mmol/L (ref 98–111)
Creatinine, Ser: 0.55 mg/dL (ref 0.44–1.00)
GFR calc Af Amer: >60 mL/min (ref >60)
GFR calc non Af Amer: >60 mL/min (ref >60)
Glucose, Bld: 137 mg/dL — ABNORMAL HIGH (ref 70–99)
Potassium: 3.8 mmol/L (ref 3.5–5.1)
Sodium: 133 mmol/L — ABNORMAL LOW (ref 135–145)
Total Bilirubin: 1.3 mg/dL — ABNORMAL HIGH (ref 0.3–1.2)
Total Protein: 8.2 g/dL — ABNORMAL HIGH (ref 6.5–8.1)

## 2019-05-26 LAB — URINALYSIS, COMPLETE (UACMP) WITH MICROSCOPIC
Bacteria, UA: NONE SEEN
Bilirubin Urine: NEGATIVE
Glucose, UA: NEGATIVE mg/dL
Ketones, ur: NEGATIVE mg/dL
Leukocytes,Ua: NEGATIVE
Nitrite: NEGATIVE
Protein, ur: 100 mg/dL — AB
Specific Gravity, Urine: 1.027 (ref 1.005–1.030)
pH: 5 (ref 5.0–8.0)

## 2019-05-26 LAB — CBC
HCT: 44.9 % (ref 36.0–46.0)
Hemoglobin: 15.6 g/dL — ABNORMAL HIGH (ref 12.0–15.0)
MCH: 30.7 pg (ref 26.0–34.0)
MCHC: 34.7 g/dL (ref 30.0–36.0)
MCV: 88.4 fL (ref 80.0–100.0)
Platelets: 197 10*3/uL (ref 150–400)
RBC: 5.08 MIL/uL (ref 3.87–5.11)
RDW: 11.9 % (ref 11.5–15.5)
WBC: 12.4 10*3/uL — ABNORMAL HIGH (ref 4.0–10.5)
nRBC: 0 % (ref 0.0–0.2)

## 2019-05-26 LAB — LIPASE, BLOOD: Lipase: 22 U/L (ref 11–51)

## 2019-05-26 LAB — POCT PREGNANCY, URINE: Preg Test, Ur: NEGATIVE

## 2019-05-26 MED ORDER — PREDNISONE 20 MG PO TABS
20.0000 mg | ORAL_TABLET | Freq: Three times a day (TID) | ORAL | Status: AC
  Administered 2019-05-27 – 2019-05-28 (×5): 20 mg via ORAL
  Filled 2019-05-26 (×6): qty 1

## 2019-05-26 MED ORDER — SODIUM CHLORIDE 0.45 % IV SOLN: INTRAVENOUS | Status: AC

## 2019-05-26 MED ORDER — OXYCODONE-ACETAMINOPHEN 7.5-325 MG PO TABS
1.0000 | ORAL_TABLET | Freq: Four times a day (QID) | ORAL | Status: DC | PRN
  Administered 2019-05-27 – 2019-05-28 (×5): 1 via ORAL
  Filled 2019-05-26 (×5): qty 1

## 2019-05-26 MED ORDER — ENOXAPARIN SODIUM 40 MG/0.4ML ~~LOC~~ SOLN: 40.0000 mg | SUBCUTANEOUS | Status: DC

## 2019-05-26 MED ORDER — MORPHINE SULFATE (PF) 4 MG/ML IV SOLN: 4.0000 mg | Freq: Once | INTRAVENOUS | Status: DC

## 2019-05-26 MED ORDER — SODIUM CHLORIDE 0.9 % IV BOLUS
1000.0000 mL | Freq: Once | INTRAVENOUS | Status: AC
  Administered 2019-05-26: 1000 mL via INTRAVENOUS

## 2019-05-26 MED ORDER — METRONIDAZOLE IN NACL 5-0.79 MG/ML-% IV SOLN
500.0000 mg | Freq: Once | INTRAVENOUS | Status: AC
  Administered 2019-05-26: 500 mg via INTRAVENOUS
  Filled 2019-05-26: qty 100

## 2019-05-26 MED ORDER — ACETAMINOPHEN 325 MG PO TABS
650.0000 mg | ORAL_TABLET | Freq: Four times a day (QID) | ORAL | Status: DC | PRN
  Administered 2019-05-28 – 2019-05-30 (×3): 650 mg via ORAL
  Filled 2019-05-26 (×4): qty 2

## 2019-05-26 MED ORDER — SODIUM CHLORIDE 0.9% FLUSH
3.0000 mL | Freq: Once | INTRAVENOUS | Status: AC
  Administered 2019-05-26: 3 mL via INTRAVENOUS

## 2019-05-26 MED ORDER — MORPHINE SULFATE (PF) 4 MG/ML IV SOLN
4.0000 mg | Freq: Once | INTRAVENOUS | Status: AC
  Administered 2019-05-26: 4 mg via INTRAVENOUS
  Filled 2019-05-26: qty 1

## 2019-05-26 MED ORDER — ONDANSETRON HCL 4 MG PO TABS: 4.0000 mg | ORAL_TABLET | Freq: Four times a day (QID) | ORAL | Status: DC | PRN

## 2019-05-26 MED ORDER — ACETAMINOPHEN 650 MG RE SUPP: 650.0000 mg | Freq: Four times a day (QID) | RECTAL | Status: DC | PRN

## 2019-05-26 MED ORDER — METRONIDAZOLE IN NACL 5-0.79 MG/ML-% IV SOLN
500.0000 mg | Freq: Three times a day (TID) | INTRAVENOUS | Status: DC
  Administered 2019-05-27 – 2019-05-30 (×8): 500 mg via INTRAVENOUS
  Filled 2019-05-26 (×12): qty 100

## 2019-05-26 MED ORDER — ONDANSETRON HCL 4 MG/2ML IJ SOLN
4.0000 mg | Freq: Four times a day (QID) | INTRAMUSCULAR | Status: DC | PRN
  Administered 2019-05-27 – 2019-05-29 (×2): 4 mg via INTRAVENOUS
  Filled 2019-05-26 (×2): qty 2

## 2019-05-26 MED ORDER — IOHEXOL 300 MG/ML  SOLN
100.0000 mL | Freq: Once | INTRAMUSCULAR | Status: AC | PRN
  Administered 2019-05-26: 100 mL via INTRAVENOUS
  Filled 2019-05-26: qty 100

## 2019-05-26 MED ORDER — ONDANSETRON HCL 4 MG/2ML IJ SOLN
4.0000 mg | Freq: Once | INTRAMUSCULAR | Status: AC
  Administered 2019-05-26: 4 mg via INTRAVENOUS
  Filled 2019-05-26: qty 2

## 2019-05-26 MED ORDER — IOHEXOL 9 MG/ML PO SOLN
500.0000 mL | Freq: Two times a day (BID) | ORAL | Status: DC | PRN
  Administered 2019-05-26: 500 mL via ORAL
  Filled 2019-05-26 (×2): qty 500

## 2019-05-26 MED ORDER — CIPROFLOXACIN IN D5W 400 MG/200ML IV SOLN
400.0000 mg | Freq: Once | INTRAVENOUS | Status: AC
  Administered 2019-05-26: 400 mg via INTRAVENOUS
  Filled 2019-05-26: qty 200

## 2019-05-26 MED ORDER — CIPROFLOXACIN IN D5W 400 MG/200ML IV SOLN
400.0000 mg | Freq: Two times a day (BID) | INTRAVENOUS | Status: DC
  Filled 2019-05-26 (×2): qty 200

## 2019-05-26 MED ORDER — DICYCLOMINE HCL 10 MG PO CAPS
20.0000 mg | ORAL_CAPSULE | Freq: Once | ORAL | Status: AC
  Administered 2019-05-26: 20 mg via ORAL
  Filled 2019-05-26: qty 2

## 2019-05-26 MED ORDER — CIPROFLOXACIN IN D5W 400 MG/200ML IV SOLN
400.0000 mg | Freq: Two times a day (BID) | INTRAVENOUS | Status: DC
  Administered 2019-05-27 – 2019-05-30 (×7): 400 mg via INTRAVENOUS
  Filled 2019-05-26 (×9): qty 200

## 2019-05-26 NOTE — ED Triage Notes (Signed)
Pt c/o abd pain that feels like contractions for the past 3 days.

## 2019-05-26 NOTE — ED Provider Notes (Signed)
[facility] Emergency Department Provider Note  ____________________________________________  Time seen: Approximately 5:32 PM  I have reviewed the triage vital signs and the nursing notes.   HISTORY  Chief Complaint Abdominal Pain    HPI Kim Crawford is a 28 y.o. female who presents the emergency department complaining of cramps/spasms in her abdomen x3 days.  Patient reports that she is having cramps that she best describes as feelings of contractions in her abdomen.  Patient states that they are sporadic in nature, not constant.  Cramps will occur in different parts of her abdomen, sometimes in the upper, sometimes in the lower abdomen.  Patient states that she has had much milder versions with IBS your period cramps but this was much more intense and lasted longer than any other issue.  She has had no nausea, vomiting.  She has loose stools but no frank diarrhea.  No constipation.  Patient has no hematic emesis or hematochezia.  No dysuria, polyuria, hematuria.  No vaginal bleeding or discharge.         Past Medical History:  Diagnosis Date  . Breast mass, right 2014  . Eczema   . PCOS (polycystic ovarian syndrome)     Patient Active Problem List   Diagnosis Date Noted  . Pilonidal cyst   . Sepsis ([place]) 01/03/2018  . Cellulitis 01/03/2018    Past Surgical History:  Procedure Laterality Date  . BREAST SURGERY Right 2014   breast mass  . CESAREAN SECTION  2011  . PILONIDAL CYST EXCISION N/A 02/27/2018   Procedure: CYST EXCISION PILONIDAL SIMPLE;  Surgeon: Jules Husbands, MD;  Location: [facility] ORS;  Service: General;  Laterality: N/A;    Prior to Admission medications   Medication Sig Start Date End Date Taking? Authorizing Provider  albuterol (PROVENTIL HFA;VENTOLIN HFA) 108 (90 Base) MCG/ACT inhaler Inhale 2 puffs into the lungs every 6 (six) hours as needed for wheezing or shortness of breath. Patient not taking: Reported on 09/16/2018  08/04/18   Laban Emperor, PA-C  azithromycin (ZITHROMAX Z-PAK) 250 MG tablet Take 2 tablets (500 mg) on  Day 1,  followed by 1 tablet (250 mg) once daily on Days 2 through 5. Patient not taking: Reported on 09/16/2018 08/04/18   Laban Emperor, PA-C  HYDROcodone-acetaminophen (NORCO/VICODIN) 5-325 MG tablet Take 1-2 tablets by mouth every 4 (four) hours as needed for moderate pain. Patient not taking: Reported on 09/16/2018 02/27/18   Jules Husbands, MD    Allergies Toradol [ketorolac tromethamine], Tramadol, and Penicillins  Family History  Adopted: Yes  Problem Relation Age of Onset  . Hypertension Mother   . Parkinson's disease Father     Social History Social History   Tobacco Use  . Smoking status: Current Every Day Smoker    Packs/day: 0.50    Years: 7.00    Pack years: 3.50    Types: Cigarettes  . Smokeless tobacco: Never Used  Substance Use Topics  . Alcohol use: Yes    Comment: occasionally  . Drug use: No     Review of Systems  Constitutional: No fever/chills Eyes: No visual changes. No discharge ENT: No upper respiratory complaints. Cardiovascular: no chest pain. Respiratory: no cough. No SOB. Gastrointestinal: Diffuse abdominal pain.  Patient reports contraction-like pain.  Pain occurs in multiple areas of the abdomen..  No nausea, no vomiting.  No diarrhea.  No constipation. Genitourinary: Negative for dysuria. No hematuria Musculoskeletal: Negative for musculoskeletal pain. Skin: Negative for rash, abrasions, lacerations, ecchymosis. Neurological:  Negative for headaches, focal weakness or numbness. 10-point ROS otherwise negative.  ____________________________________________   PHYSICAL EXAM:  VITAL SIGNS: ED Triage Vitals  Enc Vitals Group     BP 05/26/19 1125 (!) 131/94     Pulse Rate 05/26/19 1125 (!) 103     Resp 05/26/19 1125 16     Temp 05/26/19 1125 98.6 F (37 C)     Temp src --      SpO2 05/26/19 1125 98 %     Weight 05/26/19 1123 160 lb  0.9 oz (72.6 kg)     Height 05/26/19 1123 5' 4"  (1.626 m)     Head Circumference --      Peak Flow --      Pain Score 05/26/19 1122 9     Pain Loc --      Pain Edu? --      Excl. in [place]? --      Constitutional: Alert and oriented. Well appearing and in no acute distress. Eyes: Conjunctivae are normal. PERRL. EOMI. Head: Atraumatic. ENT:      Ears:       Nose: No congestion/rhinnorhea.      Mouth/Throat: Mucous membranes are moist.  Neck: No stridor.    Cardiovascular: Normal rate, regular rhythm. Normal S1 and S2.  Good peripheral circulation. Respiratory: Normal respiratory effort without tachypnea or retractions. Lungs CTAB. Good air entry to the bases with no decreased or absent breath sounds. Gastrointestinal: No visible abnormality.  Bowel sounds 4 quadrants. Soft to palpation all quadrants.  Patient is tender to palpation in both the left upper and left lower quadrants.. No guarding or rigidity. No palpable masses. No distention. No CVA tenderness. Musculoskeletal: Full range of motion to all extremities. No gross deformities appreciated. Neurologic:  Normal speech and language. No gross focal neurologic deficits are appreciated.  Skin:  Skin is warm, dry and intact. No rash noted. Psychiatric: Mood and affect are normal. Speech and behavior are normal. Patient exhibits appropriate insight and judgement.   ____________________________________________   LABS (all labs ordered are listed, but only abnormal results are displayed)  Labs Reviewed  COMPREHENSIVE METABOLIC PANEL - Abnormal; Notable for the following components:      Result Value   Sodium 133 (*)    CO2 20 (*)    Glucose, Bld 137 (*)    Total Protein 8.2 (*)    ALT 64 (*)    Total Bilirubin 1.3 (*)    All other components within normal limits  CBC - Abnormal; Notable for the following components:   WBC 12.4 (*)    Hemoglobin 15.6 (*)    All other components within normal limits  URINALYSIS, COMPLETE  (UACMP) WITH MICROSCOPIC - Abnormal; Notable for the following components:   Color, Urine YELLOW (*)    APPearance HAZY (*)    Hgb urine dipstick SMALL (*)    Protein, ur 100 (*)    All other components within normal limits  SARS CORONAVIRUS 2 (TAT 6-24 HRS)  LIPASE, BLOOD  POC URINE PREG, ED  POCT PREGNANCY, URINE   ____________________________________________  EKG   ____________________________________________  RADIOLOGY I personally viewed and evaluated these images as part of my medical decision making, as well as reviewing the written report by the radiologist.  CT ABDOMEN PELVIS W CONTRAST  Addendum Date: 05/26/2019   ADDENDUM REPORT: 05/26/2019 20:14 ADDENDUM: In the body of the report, it should state that there is a small volume of reactive free fluid in the upper  abdomen. Electronically Signed   By: Constance Holster M.D.   On: 05/26/2019 20:14   Result Date: 05/26/2019 CLINICAL DATA:  Acute generalized abdominal pain. Tenderness to palpation in the left upper and left lower quadrants. Diarrhea. EXAM: CT ABDOMEN AND PELVIS WITH CONTRAST TECHNIQUE: Multidetector CT imaging of the abdomen and pelvis was performed using the standard protocol following bolus administration of intravenous contrast. CONTRAST:  182m OMNIPAQUE IOHEXOL 300 MG/ML  SOLN COMPARISON:  None. FINDINGS: Lower chest: There is atelectasis at the lung bases bilaterally.The heart size is normal. Hepatobiliary: There is decreased hepatic attenuation suggestive of hepatic steatosis. Normal gallbladder.There is no biliary ductal dilation. Pancreas: Normal contours without ductal dilatation. No peripancreatic fluid collection. Spleen: No splenic laceration or hematoma. Adrenals/Urinary Tract: --Adrenal glands: No adrenal hemorrhage. --Right kidney/ureter: No hydronephrosis or perinephric hematoma. --Left kidney/ureter: No hydronephrosis or perinephric hematoma. --Urinary bladder: The urinary bladder is decompressed  which limits evaluation. Stomach/Bowel: --Stomach/Duodenum: No hiatal hernia or other gastric abnormality. Normal duodenal course and caliber. --Small bowel: There is severe diffuse wall thickening of the terminal ileum with surrounding inflammatory changes. There is no evidence for small bowel obstruction. There is a small amount of reactive free fluid in the patient's right lower quadrant and pelvis. --Colon: No focal abnormality. --Appendix: Normal. Vascular/Lymphatic: Normal course and caliber of the major abdominal vessels. --No retroperitoneal lymphadenopathy. --No mesenteric lymphadenopathy. --No pelvic or inguinal lymphadenopathy. Reproductive: Unremarkable Other: No ascites or free air. The abdominal wall is normal. Musculoskeletal. No acute displaced fractures. IMPRESSION: 1. Severe diffuse wall thickening of the terminal ileum with surrounding inflammatory changes and small amount of reactive free fluid in the patient's right lower quadrant and pelvis. Findings are concerning for an acute infectious or inflammatory enteritis as can be seen in Crohn's disease. There is no evidence for a small bowel obstruction. 2. Hepatic steatosis. 3. Normal appendix in the right lower quadrant. 4. Bibasilar atelectasis. Electronically Signed: By: CConstance HolsterM.D. On: 05/26/2019 20:06    ____________________________________________    PROCEDURES  Procedure(s) performed:    Procedures    Medications  iohexol (OMNIPAQUE) 9 MG/ML oral solution 500 mL (500 mLs Oral Contrast Given 05/26/19 1704)  ciprofloxacin (CIPRO) IVPB 400 mg (400 mg Intravenous New Bag/Given 05/26/19 2037)  metroNIDAZOLE (FLAGYL) IVPB 500 mg (has no administration in time range)  sodium chloride 0.9 % bolus 1,000 mL (has no administration in time range)  sodium chloride flush (NS) 0.9 % injection 3 mL (3 mLs Intravenous Given 05/26/19 2037)  iohexol (OMNIPAQUE) 300 MG/ML solution 100 mL (100 mLs Intravenous Contrast Given  05/26/19 1947)  morphine 4 MG/ML injection 4 mg (4 mg Intravenous Given 05/26/19 2004)  dicyclomine (BENTYL) capsule 20 mg (20 mg Oral Given 05/26/19 2008)  ondansetron (ZOFRAN) injection 4 mg (4 mg Intravenous Given 05/26/19 2003)     ____________________________________________   INITIAL IMPRESSION / ASSESSMENT AND PLAN / ED COURSE  Pertinent labs & imaging results that were available during my care of the patient were reviewed by me and considered in my medical decision making (see chart for details).  Review of the [place] CSRS was [place] of the [place] drugs.           Patient's diagnosis is consistent with enteritis.  Patient presented to the emergency department with contraction-like pain in her abdomen.  Patient reports that pain has been worsening and contraction/spasm feeling has been increasing in frequency.  Patient denied any nausea, vomiting, diarrhea or constipation.  No urinary symptoms.  Patient was tender to palpation over the left upper and lower quadrants.  Patient had a slightly elevated white blood cell count.  Patient states that she does have a history of "IBS" but does not take any medications for same.  Findings on CT scan are concerning for severe enteritis, likely secondary to Crohn's.  Is in significant amounts of pain and does not feel like she can manage at home as she lives by herself.  Given amount of inflammation/infection I feel that patient would be a good candidate for IV antibiotics, pain medication until patient is able to be discharged.  Consult to the hospital service is placed at this time.. Patient is admitted to the hospital service at this time.    ____________________________________________  FINAL CLINICAL IMPRESSION(S) / ED DIAGNOSES  Final diagnoses:  Enteritis      NEW MEDICATIONS STARTED DURING THIS VISIT:  ED Discharge Orders    None          This chart was dictated using  voice recognition software/Dragon. Despite best efforts to proofread, errors can occur which can change the meaning. Any change was purely unintentional.    Brynda Peon 05/26/19 2109    Nance Pear, MD 05/26/19 2131

## 2019-05-26 NOTE — ED Triage Notes (Signed)
C/O abdominal pain x 3 days.  Also c/o diarrhea.  STates pain worsened last night.  Describes pain and contraction like pain.

## 2019-05-26 NOTE — ED Notes (Signed)
Pt given crackers and peanut butter and sprite to drink.

## 2019-05-26 NOTE — H&P (Signed)
History and Physical    Kim Crawford XOV:[id number] DOB: [birth year] DOA: 05/26/2019  PCP: System, Pcp Not In (Confirm with patient/family/NH records and if not entered, this has to be entered at [facility] point of entry) Patient coming from: Patient is coming from home  I have personally briefly reviewed patient's old medical records in [place]  Chief Complaint: Nominal pain lower quadrants persistent  HPI: Kim Crawford is a 28 y.o. female with medical history significant of PCOS, unconfirmed diagnosis of IBS.  Patient reports for the last several days she has been having abdominal pain predominantly in the lower quadrants.  He described this is moderate to severe and equates it to pain of contractions.  She denies having nausea or vomiting.  Has had no hematemesis or hematochezia.  She denies diarrhea.  He has had a good appetite.  Because of her persistent intermittent pain she presents to the [facility] ED for evaluation. (For level 3, the HPI must include 4+ descriptors: Location, Quality, Severity, Duration, Timing, Context, modifying factors, associated signs/symptoms and/or status of 3+ chronic problems.)  (Please avoid self-populating past medical history here) (The initial 2-3 lines should be focused and good to copy and paste in the HPI section of the daily progress note).  ED Course: Hemodynamically stable.  Laboratory revealed the patient to have a mild leukocytosis at 14,400.  CT scan of the abdomen and pelvis revealed bowel wall thickening and inflammation at the terminal ileum.  TRH was asked admit the patient for ileitis for antibiotic treatment.  Review of Systems: As per HPI otherwise 10 point review of systems negative.    Past Medical History:  Diagnosis Date  . Breast mass, right 2014  . Eczema   . PCOS (polycystic ovarian syndrome)     Past Surgical History:  Procedure Laterality Date  . BREAST SURGERY Right 2014   breast mass  . CESAREAN SECTION  2011  .  PILONIDAL CYST EXCISION N/A 02/27/2018   Procedure: CYST EXCISION PILONIDAL SIMPLE;  Surgeon: Jules Husbands, MD;  Location: [facility] ORS;  Service: General;  Laterality: N/A;   Soc hx -single mother of a 42-year-old son.  Lives independently.  Her mother does live nearby.  Patient works as an Electronics engineer.   reports that she has been smoking cigarettes. She has a 3.50 pack-year smoking history. She has never used smokeless tobacco. She reports current alcohol use. She reports that she does not use drugs.  Allergies  Allergen Reactions  . Toradol [Ketorolac Tromethamine] Other (See Comments)    "Suicide headaches"  . Tramadol Other (See Comments)    Severe headaches  . Penicillins Rash and Other (See Comments)    Has patient had a PCN reaction causing immediate rash, facial/tongue/throat swelling, SOB or lightheadedness with hypotension: Unknown Has patient had a PCN reaction causing severe rash involving mucus membranes or skin necrosis: No Has patient had a PCN reaction that required hospitalization: No Has patient had a PCN reaction occurring within the last 10 years: No If all of the above answers are "NO", then may proceed with Cephalosporin use.     Family History  Adopted: Yes  Problem Relation Age of Onset  . Hypertension Mother   . Parkinson's disease Father    Unacceptable: Noncontributory, unremarkable, or negative. Acceptable: Family history reviewed and not pertinent (If you reviewed it)  Prior to Admission medications   Medication Sig Start Date End Date Taking? Authorizing Provider  albuterol (PROVENTIL HFA;VENTOLIN HFA) 108 (90 Base)  MCG/ACT inhaler Inhale 2 puffs into the lungs every 6 (six) hours as needed for wheezing or shortness of breath. Patient not taking: Reported on 09/16/2018 08/04/18   Laban Emperor, PA-C  azithromycin (ZITHROMAX Z-PAK) 250 MG tablet Take 2 tablets (500 mg) on  Day 1,  followed by 1 tablet (250 mg) once daily on Days 2 through 5. Patient  not taking: Reported on 09/16/2018 08/04/18   Laban Emperor, PA-C  HYDROcodone-acetaminophen (NORCO/VICODIN) 5-325 MG tablet Take 1-2 tablets by mouth every 4 (four) hours as needed for moderate pain. Patient not taking: Reported on 09/16/2018 02/27/18   Jules Husbands, MD    Physical Exam: Vitals:   05/26/19 1123 05/26/19 1125 05/26/19 2137  BP:  (!) 131/94 139/88  Pulse:  (!) 103 87  Resp:  16 18  Temp:  98.6 F (37 C)   SpO2:  98% 97%  Weight: 72.6 kg    Height: 5' 4"  (1.626 m)      Constitutional: NAD, calm, comfortable Vitals:   05/26/19 1123 05/26/19 1125 05/26/19 2137  BP:  (!) 131/94 139/88  Pulse:  (!) 103 87  Resp:  16 18  Temp:  98.6 F (37 C)   SpO2:  98% 97%  Weight: 72.6 kg    Height: 5' 4"  (1.626 m)     General appearance: Overweight woman in no acute distress  eyes: PERRL, lids and conjunctivae normal ENMT: Mucous membranes are moist. Posterior pharynx clear of any exudate or lesions.Normal dentition.  Neck: normal, supple, no masses, no thyromegaly Respiratory: clear to auscultation bilaterally, no wheezing, no crackles. Normal respiratory effort. No accessory muscle use.  Cardiovascular: Regular rate and rhythm, no murmurs / rubs / gallops. No extremity edema. 2+ pedal pulses. No carotid bruits.  Abdomen: Obese.tenderness to palpation.  Weekly tender to deep palpation in the left lower quadrant.  No guarding or rebound appreciated external exam deferred musculoskeletal: no clubbing / cyanosis. No joint deformity upper and lower extremities. Good ROM, no contractures. Normal muscle tone.  Skin: no rashes, lesions, ulcers. No induration Neurologic: CN 2-12 grossly intact. Sensation intact, DTR normal. Strength 5/5 in all 4.  Psychiatric: Normal judgment and insight. Alert and oriented x 3. Normal mood.   (Anything < 9 systems with 2 bullets each down codes to level 1) (If patient refuses exam can't bill higher level) (Make sure to document decubitus ulcers  present on admission -- if possible -- and whether patient has chronic indwelling catheter at time of admission)  Labs on Admission: I have personally reviewed following labs and imaging studies  CBC: Recent Labs  Lab 05/26/19 1129  WBC 12.4*  HGB 15.6*  HCT 44.9  MCV 88.4  PLT 650   Basic Metabolic Panel: Recent Labs  Lab 05/26/19 1129  NA 133*  K 3.8  CL 103  CO2 20*  GLUCOSE 137*  BUN 11  CREATININE 0.55  CALCIUM 9.1   GFR: Estimated Creatinine Clearance: 100.5 mL/min (by C-G formula based on SCr of 0.55 mg/dL). Liver Function Tests: Recent Labs  Lab 05/26/19 1129  AST 39  ALT 64*  ALKPHOS 73  BILITOT 1.3*  PROT 8.2*  ALBUMIN 4.6   Recent Labs  Lab 05/26/19 1129  LIPASE 22   No results for input(s): AMMONIA in the last 168 hours. Coagulation Profile: No results for input(s): INR, PROTIME in the last 168 hours. Cardiac Enzymes: No results for input(s): CKTOTAL, CKMB, CKMBINDEX, TROPONINI in the last 168 hours. BNP (last 3 results) No  results for input(s): PROBNP in the last 8760 hours. HbA1C: No results for input(s): HGBA1C in the last 72 hours. CBG: No results for input(s): GLUCAP in the last 168 hours. Lipid Profile: No results for input(s): CHOL, HDL, LDLCALC, TRIG, CHOLHDL, LDLDIRECT in the last 72 hours. Thyroid Function Tests: No results for input(s): TSH, T4TOTAL, FREET4, T3FREE, THYROIDAB in the last 72 hours. Anemia Panel: No results for input(s): VITAMINB12, FOLATE, FERRITIN, TIBC, IRON, RETICCTPCT in the last 72 hours. Urine analysis:    Component Value Date/Time   COLORURINE YELLOW (A) 05/26/2019 1129   APPEARANCEUR HAZY (A) 05/26/2019 1129   LABSPEC 1.027 05/26/2019 1129   PHURINE 5.0 05/26/2019 1129   GLUCOSEU NEGATIVE 05/26/2019 1129   HGBUR SMALL (A) 05/26/2019 1129   BILIRUBINUR NEGATIVE 05/26/2019 1129   KETONESUR NEGATIVE 05/26/2019 1129   PROTEINUR 100 (A) 05/26/2019 1129   NITRITE NEGATIVE 05/26/2019 1129   LEUKOCYTESUR  NEGATIVE 05/26/2019 1129    Radiological Exams on Admission: CT ABDOMEN PELVIS W CONTRAST  Addendum Date: 05/26/2019   ADDENDUM REPORT: 05/26/2019 20:14 ADDENDUM: In the body of the report, it should state that there is a small volume of reactive free fluid in the upper abdomen. Electronically Signed   By: Constance Holster M.D.   On: 05/26/2019 20:14   Result Date: 05/26/2019 CLINICAL DATA:  Acute generalized abdominal pain. Tenderness to palpation in the left upper and left lower quadrants. Diarrhea. EXAM: CT ABDOMEN AND PELVIS WITH CONTRAST TECHNIQUE: Multidetector CT imaging of the abdomen and pelvis was performed using the standard protocol following bolus administration of intravenous contrast. CONTRAST:  160m OMNIPAQUE IOHEXOL 300 MG/ML  SOLN COMPARISON:  None. FINDINGS: Lower chest: There is atelectasis at the lung bases bilaterally.The heart size is normal. Hepatobiliary: There is decreased hepatic attenuation suggestive of hepatic steatosis. Normal gallbladder.There is no biliary ductal dilation. Pancreas: Normal contours without ductal dilatation. No peripancreatic fluid collection. Spleen: No splenic laceration or hematoma. Adrenals/Urinary Tract: --Adrenal glands: No adrenal hemorrhage. --Right kidney/ureter: No hydronephrosis or perinephric hematoma. --Left kidney/ureter: No hydronephrosis or perinephric hematoma. --Urinary bladder: The urinary bladder is decompressed which limits evaluation. Stomach/Bowel: --Stomach/Duodenum: No hiatal hernia or other gastric abnormality. Normal duodenal course and caliber. --Small bowel: There is severe diffuse wall thickening of the terminal ileum with surrounding inflammatory changes. There is no evidence for small bowel obstruction. There is a small amount of reactive free fluid in the patient's right lower quadrant and pelvis. --Colon: No focal abnormality. --Appendix: Normal. Vascular/Lymphatic: Normal course and caliber of the major abdominal  vessels. --No retroperitoneal lymphadenopathy. --No mesenteric lymphadenopathy. --No pelvic or inguinal lymphadenopathy. Reproductive: Unremarkable Other: No ascites or free air. The abdominal wall is normal. Musculoskeletal. No acute displaced fractures. IMPRESSION: 1. Severe diffuse wall thickening of the terminal ileum with surrounding inflammatory changes and small amount of reactive free fluid in the patient's right lower quadrant and pelvis. Findings are concerning for an acute infectious or inflammatory enteritis as can be seen in Crohn's disease. There is no evidence for a small bowel obstruction. 2. Hepatic steatosis. 3. Normal appendix in the right lower quadrant. 4. Bibasilar atelectasis. Electronically Signed: By: CConstance HolsterM.D. On: 05/26/2019 20:06    EKG: Independently reviewed.  No EKG on chart  Assessment/Plan Active Problems:   [place]   History of IBS  (please populate well all problems here in Problem List. (For example, if patient is on BP meds at home and you resume or decide to hold them, it is a  problem that needs to be her. Same for CAD, COPD, HLD and so on)   1.  Ileitis -patient with history of IBS by her report with no GI confirmation.  Now presenting with significant abdominal pain, mild leukocytosis and CT evidence of terminal ileitis. Plan MedSurg admission  IV antibiotics: Cipro 400 mg every 12, Flagyl 500 mg every 8  Steroids for inflammation of Crohn's disease: Cipro 20 mg 3 times daily  GI consult -for evaluation and possible colonoscopy with biopsy  DVT prophylaxis: Lovenox (Lovenox/Heparin/SCD's/anticoagulated/None (if comfort care) Code Status: Full code (Full/Partial (specify details) Family Communication: Mother was present during the interview.  All questions were answered.  (Specify name, relationship. Do not write "discussed with patient". Specify tel # if discussed over the phone) Disposition Plan: Home when medically stable  (specify when and where you expect patient to be discharged) Consults called:  gastroenterology -to be called in the a.m. (with names) Admission status: Inpatient (inpatient / obs / tele / medical floor / SDU)   Adella Hare MD [facility] Pager [phone]  If 7PM-7AM, please contact night-coverage [email or web address] Password [email or web address]  05/26/2019, 10:55 PM

## 2019-05-26 NOTE — ED Notes (Signed)
Pt brought back to room 47 with c/o abd pain and diarrhea. Pt states its more the pain that brought her in vs the diarrhea. Pt able to ambulate from the lobby with steady gait

## 2019-05-26 NOTE — ED Notes (Signed)
Pt unhooked and allowed to walk to the bathroom.

## 2019-05-27 ENCOUNTER — Other Ambulatory Visit: Payer: Self-pay

## 2019-05-27 LAB — CBC
HCT: 38.8 % (ref 36.0–46.0)
Hemoglobin: 13.5 g/dL (ref 12.0–15.0)
MCH: 31 pg (ref 26.0–34.0)
MCHC: 34.8 g/dL (ref 30.0–36.0)
MCV: 89 fL (ref 80.0–100.0)
Platelets: 168 10*3/uL (ref 150–400)
RBC: 4.36 MIL/uL (ref 3.87–5.11)
RDW: 12 % (ref 11.5–15.5)
WBC: 8.3 10*3/uL (ref 4.0–10.5)
nRBC: 0 % (ref 0.0–0.2)

## 2019-05-27 LAB — HIV ANTIBODY (ROUTINE TESTING W REFLEX): HIV Screen 4th Generation wRfx: NONREACTIVE

## 2019-05-27 LAB — HEMOGLOBIN A1C
Hgb A1c MFr Bld: 7.5 % — ABNORMAL HIGH (ref 4.8–5.6)
Mean Plasma Glucose: 168.55 mg/dL

## 2019-05-27 LAB — SARS CORONAVIRUS 2 (TAT 6-24 HRS): SARS Coronavirus 2: NEGATIVE

## 2019-05-27 MED ORDER — DICYCLOMINE HCL 10 MG PO CAPS
10.0000 mg | ORAL_CAPSULE | Freq: Three times a day (TID) | ORAL | Status: DC
  Administered 2019-05-27: 10 mg via ORAL
  Filled 2019-05-27 (×2): qty 1

## 2019-05-27 MED ORDER — SODIUM CHLORIDE 0.9% FLUSH: 10.0000 mL | INTRAVENOUS | Status: DC | PRN

## 2019-05-27 MED ORDER — CHLORHEXIDINE GLUCONATE CLOTH 2 % EX PADS
6.0000 | MEDICATED_PAD | Freq: Every day | CUTANEOUS | Status: DC
  Administered 2019-05-28: 6 via TOPICAL

## 2019-05-27 MED ORDER — DICYCLOMINE HCL 10 MG/ML IM SOLN
10.0000 mg | Freq: Two times a day (BID) | INTRAMUSCULAR | Status: DC | PRN
  Administered 2019-05-28: 10 mg via INTRAMUSCULAR
  Filled 2019-05-27 (×2): qty 1

## 2019-05-27 NOTE — ED Notes (Signed)
Report to floor, patient ready for transport to 142.

## 2019-05-27 NOTE — ED Notes (Signed)
ambulatory to BR, steady, med for nausea and pain.

## 2019-05-27 NOTE — ED Notes (Signed)
Pt ambulatory to the bathroom 

## 2019-05-27 NOTE — Consult Note (Signed)
Cephas Darby, MD [address]  [place]  [place], [place] [address]  Main: [phone]  Fax: [phone] Pager: [phone]   Consultation  Referring Provider:     No ref. provider found Primary Care Physician:  System, Pcp Not In Primary Gastroenterologist:  None        Reason for Consultation:    Date of Admission:  05/26/2019 Date of Consultation:  05/27/2019         HPI:   Kim Crawford is a 28 y.o. female with history of PCOS, presented yesterday to the ER with 4 days of lower abdominal pain similar to contractions associated with nausea and postprandial urgency.  Patient said she had chicken from [employer] prior to the onset of symptoms.  She does not have any other associated symptoms. She is found to have mild leukocytosis and proteinuria.  Urine pregnancy test was negative, SARS COVID-19 negative.  She underwent CT abdomen and pelvis with contrast which revealed severe diffuse wall thickening of the terminal ileum with surrounding inflammatory changes and small amount of reactive free fluid in the patient's right lower quadrant and pelvis.  There is no evidence of small bowel obstruction.  Normal appendix.  GI is therefore consulted for further evaluation She is empirically started on Cipro and Flagyl.  Her leukocytosis resolved this morning.  She continues to have lower abdominal Cramping pain which is significantly improved since admission.  She is tolerating regular diet today.  Patient is adopted, unknown family history Patient denies prior episodes in the past. She does smoke cigarettes  NSAIDs: None  Antiplts/Anticoagulants/Anti thrombotics: None  GI Procedures: None  Past Medical History:  Diagnosis Date  . Breast mass, right 2014  . Eczema   . PCOS (polycystic ovarian syndrome)     Past Surgical History:  Procedure Laterality Date  . BREAST SURGERY Right 2014   breast mass  . CESAREAN SECTION  2011  . PILONIDAL CYST EXCISION N/A  02/27/2018   Procedure: CYST EXCISION PILONIDAL SIMPLE;  Surgeon: Jules Husbands, MD;  Location: [facility] ORS;  Service: General;  Laterality: N/A;    Prior to Admission medications   Medication Sig Start Date End Date Taking? Authorizing Provider  ibuprofen (ADVIL) 200 MG tablet Take 200 mg by mouth every 6 (six) hours as needed for fever, headache or mild pain.   Yes [provider]   Current Facility-Administered Medications:  .  acetaminophen (TYLENOL) tablet 650 mg, 650 mg, Oral, Q6H PRN **OR** acetaminophen (TYLENOL) suppository 650 mg, 650 mg, Rectal, Q6H PRN, Norins, Heinz Knuckles, MD .  ciprofloxacin (CIPRO) IVPB 400 mg, 400 mg, Intravenous, Q12H, Norins, Heinz Knuckles, MD, Stopped at 05/27/19 1124 .  dicyclomine (BENTYL) capsule 10 mg, 10 mg, Oral, TID AC, Lorella Nimrod, MD, 10 mg at 05/27/19 1124 .  enoxaparin (LOVENOX) injection 40 mg, 40 mg, Subcutaneous, Q24H, Norins, Heinz Knuckles, MD .  iohexol (OMNIPAQUE) 9 MG/ML oral solution 500 mL, 500 mL, Oral, BID PRN, Cuthriell, Jonathan D, PA-C, 500 mL at 05/26/19 1704 .  metroNIDAZOLE (FLAGYL) IVPB 500 mg, 500 mg, Intravenous, Q8H, Norins, Heinz Knuckles, MD, Stopped at 05/27/19 1318 .  ondansetron (ZOFRAN) tablet 4 mg, 4 mg, Oral, Q6H PRN **OR** ondansetron (ZOFRAN) injection 4 mg, 4 mg, Intravenous, Q6H PRN, Norins, Heinz Knuckles, MD, 4 mg at 05/27/19 0803 .  oxyCODONE-acetaminophen (PERCOCET) 7.5-325 MG per tablet 1 tablet, 1 tablet, Oral, Q6H PRN, Norins, Heinz Knuckles, MD, 1 tablet at 05/27/19 1602 .  predniSONE (  DELTASONE) tablet 20 mg, 20 mg, Oral, TID, Norins, Heinz Knuckles, MD, 20 mg at 05/27/19 1602   Family History  Adopted: Yes  Problem Relation Age of Onset  . Hypertension Mother   . Parkinson's disease Father      Social History   Tobacco Use  . Smoking status: Current Every Day Smoker    Packs/day: 0.50    Years: 7.00    Pack years: 3.50    Types: Cigarettes  . Smokeless tobacco: Never Used  Substance Use Topics  . Alcohol use: Yes     Comment: occasionally  . Drug use: No    Allergies as of 05/26/2019 - Review Complete 05/26/2019  Allergen Reaction Noted  . Toradol [ketorolac tromethamine] Other (See Comments) 05/17/2018  . Tramadol Other (See Comments) 01/03/2018  . Penicillins Rash and Other (See Comments) 03/07/2013    Review of Systems:    All systems reviewed and negative except where noted in HPI.   Physical Exam:  Vital signs in last 24 hours: Temp:  [97.8 F (36.6 C)-98.1 F (36.7 C)] 97.8 F (36.6 C) (12/14 1536) Pulse Rate:  [87-103] 103 (12/14 1536) Resp:  [16-18] 16 (12/14 1536) BP: (116-139)/(66-92) 126/92 (12/14 1536) SpO2:  [94 %-97 %] 94 % (12/14 1536) Last BM Date: 05/26/19 General:   Pleasant, cooperative in NAD Head:  Normocephalic and atraumatic. Eyes:   No icterus.   Conjunctiva pink. PERRLA. Ears:  Normal auditory acuity. Neck:  Supple; no masses or thyroidomegaly Lungs: Respirations even and unlabored. Lungs clear to auscultation bilaterally.   No wheezes, crackles, or rhonchi.  Heart:  Regular rate and rhythm;  Without murmur, clicks, rubs or gallops Abdomen:  Soft, nondistended, mild right lower quadrant tenderness. Normal bowel sounds. No appreciable masses or hepatomegaly.  No rebound or guarding.  Rectal:  Not performed. Msk:  Symmetrical without gross deformities.  Strength normal Extremities:  Without edema, cyanosis or clubbing. Neurologic:  Alert and oriented x3;  grossly normal neurologically. Skin:  Intact without significant lesions or rashes. Psych:  Alert and cooperative. Normal affect.  LAB RESULTS: CBC Latest Ref Rng & Units 05/27/2019 05/26/2019 01/04/2018  WBC 4.0 - 10.5 K/uL 8.3 12.4(H) 11.6(H)  Hemoglobin 12.0 - 15.0 g/dL 13.5 15.6(H) 13.4  Hematocrit 36.0 - 46.0 % 38.8 44.9 39.3  Platelets 150 - 400 K/uL 168 197 160    BMET BMP Latest Ref Rng & Units 05/26/2019 01/04/2018 01/03/2018  Glucose 70 - 99 mg/dL 137(H) 162(H) 191(H)  BUN 6 - 20 mg/dL _0 Creatinine 0.44 - 1.00 mg/dL 0.55 0.64 0.82  Sodium 135 - 145 mmol/L 133(L) 140 138  Potassium 3.5 - 5.1 mmol/L 3.8 3.8 3.6  Chloride 98 - 111 mmol/L 103 108 105  CO2 22 - 32 mmol/L 20(L) 25 22  Calcium 8.9 - 10.3 mg/dL 9.1 8.6(L) 9.0    LFT Hepatic Function Latest Ref Rng & Units 05/26/2019  Total Protein 6.5 - 8.1 g/dL 8.2(H)  Albumin 3.5 - 5.0 g/dL 4.6  AST 15 - 41 U/L 39  ALT 0 - 44 U/L 64(H)  Alk Phosphatase 38 - 126 U/L 73  Total Bilirubin 0.3 - 1.2 mg/dL 1.3(H)     STUDIES: CT ABDOMEN PELVIS W CONTRAST  Addendum Date: 05/26/2019   ADDENDUM REPORT: 05/26/2019 20:14 ADDENDUM: In the body of the report, it should state that there is a small volume of reactive free fluid in the upper abdomen. Electronically Signed   By: Jamie Kato.D.  On: 05/26/2019 20:14   Result Date: 05/26/2019 CLINICAL DATA:  Acute generalized abdominal pain. Tenderness to palpation in the left upper and left lower quadrants. Diarrhea. EXAM: CT ABDOMEN AND PELVIS WITH CONTRAST TECHNIQUE: Multidetector CT imaging of the abdomen and pelvis was performed using the standard protocol following bolus administration of intravenous contrast. CONTRAST:  140m OMNIPAQUE IOHEXOL 300 MG/ML  SOLN COMPARISON:  None. FINDINGS: Lower chest: There is atelectasis at the lung bases bilaterally.The heart size is normal. Hepatobiliary: There is decreased hepatic attenuation suggestive of hepatic steatosis. Normal gallbladder.There is no biliary ductal dilation. Pancreas: Normal contours without ductal dilatation. No peripancreatic fluid collection. Spleen: No splenic laceration or hematoma. Adrenals/Urinary Tract: --Adrenal glands: No adrenal hemorrhage. --Right kidney/ureter: No hydronephrosis or perinephric hematoma. --Left kidney/ureter: No hydronephrosis or perinephric hematoma. --Urinary bladder: The urinary bladder is decompressed which limits evaluation. Stomach/Bowel: --Stomach/Duodenum: No hiatal hernia or other  gastric abnormality. Normal duodenal course and caliber. --Small bowel: There is severe diffuse wall thickening of the terminal ileum with surrounding inflammatory changes. There is no evidence for small bowel obstruction. There is a small amount of reactive free fluid in the patient's right lower quadrant and pelvis. --Colon: No focal abnormality. --Appendix: Normal. Vascular/Lymphatic: Normal course and caliber of the major abdominal vessels. --No retroperitoneal lymphadenopathy. --No mesenteric lymphadenopathy. --No pelvic or inguinal lymphadenopathy. Reproductive: Unremarkable Other: No ascites or free air. The abdominal wall is normal. Musculoskeletal. No acute displaced fractures. IMPRESSION: 1. Severe diffuse wall thickening of the terminal ileum with surrounding inflammatory changes and small amount of reactive free fluid in the patient's right lower quadrant and pelvis. Findings are concerning for an acute infectious or inflammatory enteritis as can be seen in Crohn's disease. There is no evidence for a small bowel obstruction. 2. Hepatic steatosis. 3. Normal appendix in the right lower quadrant. 4. Bibasilar atelectasis. Electronically Signed: By: CConstance HolsterM.D. On: 05/26/2019 20:06      Impression / Plan:   Kim SAGOis a 28y.o. female with history of PCOS, irritable bowel syndrome who is admitted with lower abdominal pain, found to have mild leukocytosis and terminal ileitis on the CT scan  Diet as tolerated If symptoms improve by tomorrow, will discharge home on 10 days course of Cipro and Flagyl and plan for colonoscopy as outpatient.  Otherwise, will plan for colonoscopy on Wednesday, clear liquid diet tomorrow Continue empiric antibiotics Avoid opioids and NSAIDs Bentyl as needed for abdominal cramps  Thank you for involving me in the care of this patient.  Dr. TBonna Gainswill cover tomorrow    LOS: 1 day   RSherri Sear MD  05/27/2019, 6:35 PM   Note: This  dictation was prepared with Dragon dictation along with smaller phrase technology. Any transcriptional errors that result from this process are unintentional.

## 2019-05-27 NOTE — Progress Notes (Signed)
PROGRESS NOTE    Kim Crawford  KXF:[id number] DOB: 01-12-1991 DOA: 05/26/2019 PCP: System, Pcp Not In   Brief Narrative:  Kim Crawford is a 28 y.o. female with medical history significant of PCOS, unconfirmed diagnosis of IBS.  Patient reports for the last several days she has been having abdominal pain predominantly in the lower quadrants.  She was also experiencing some nausea without any vomiting.  No diarrhea or constipation.  CT abdomen consistent with bowel thickening around terminal ileum.  She was started on Cipro and Flagyl.  Subjective: Patient continued to experience lower abdominal pain, crampy in nature.  No nausea or vomiting.  She was asking to see a GI to get a colonoscopy.  Told patient that colonoscopy will not be visible during active infection but I will ask GI.  Assessment & Plan:   Active Problems:   Ileitis, terminal ([place])   History of IBS   Ileitis.  No prior documented history of IBS.  No diarrhea or constipation.  No hematochezia.  Having mild leukocytosis and CT evidence of terminal ileitis. -GI was consulted at patient's request-appreciate their recommendations. -Continue with Cipro and Flagyl. -Add Bentyl for her abdominal spasms. -Continue pain management with Percocet as it also seems helping.  Objective: Vitals:   05/26/19 1123 05/26/19 1125 05/26/19 2137 05/27/19 0014  BP:  (!) 131/94 139/88 116/66  Pulse:  (!) 103 87 96  Resp:  16 18 17   Temp:  98.6 F (37 C)  98.1 F (36.7 C)  TempSrc:    Oral  SpO2:  98% 97% 95%  Weight: 72.6 kg     Height: 5' 4"  (1.626 m)      No intake or output data in the 24 hours ending 05/27/19 1356 Filed Weights   05/26/19 1123  Weight: 72.6 kg    Examination:  General exam: Appears calm and comfortable  Respiratory system: Clear to auscultation. Respiratory effort normal. Cardiovascular system: S1 & S2 heard, RRR. No JVD, murmurs, rubs, gallops or clicks. Gastrointestinal system: Soft, nontender,  nondistended, bowel sounds positive. Central nervous system: Alert and oriented. No focal neurological deficits.Symmetric 5 x 5 power. Extremities: No edema, no cyanosis, pulses intact and symmetrical. Skin: No rashes, lesions or ulcers Psychiatry: Judgement and insight appear normal. Mood & affect appropriate.    DVT prophylaxis: Lovenox. Code Status: Full Family Communication: Updated the patient, no family at bedside. Disposition Plan: Pending improvement.  Consultants:   GI  Procedures:  Antimicrobials:  Flagyl Cipro  Data Reviewed: I have personally reviewed following labs and imaging studies  CBC: Recent Labs  Lab 05/26/19 1129 05/27/19 1223  WBC 12.4* 8.3  HGB 15.6* 13.5  HCT 44.9 38.8  MCV 88.4 89.0  PLT 197 696   Basic Metabolic Panel: Recent Labs  Lab 05/26/19 1129  NA 133*  K 3.8  CL 103  CO2 20*  GLUCOSE 137*  BUN 11  CREATININE 0.55  CALCIUM 9.1   GFR: Estimated Creatinine Clearance: 100.5 mL/min (by C-G formula based on SCr of 0.55 mg/dL). Liver Function Tests: Recent Labs  Lab 05/26/19 1129  AST 39  ALT 64*  ALKPHOS 73  BILITOT 1.3*  PROT 8.2*  ALBUMIN 4.6   Recent Labs  Lab 05/26/19 1129  LIPASE 22   No results for input(s): AMMONIA in the last 168 hours. Coagulation Profile: No results for input(s): INR, PROTIME in the last 168 hours. Cardiac Enzymes: No results for input(s): CKTOTAL, CKMB, CKMBINDEX, TROPONINI in the last 168 hours.  BNP (last 3 results) No results for input(s): PROBNP in the last 8760 hours. HbA1C: No results for input(s): HGBA1C in the last 72 hours. CBG: No results for input(s): GLUCAP in the last 168 hours. Lipid Profile: No results for input(s): CHOL, HDL, LDLCALC, TRIG, CHOLHDL, LDLDIRECT in the last 72 hours. Thyroid Function Tests: No results for input(s): TSH, T4TOTAL, FREET4, T3FREE, THYROIDAB in the last 72 hours. Anemia Panel: No results for input(s): VITAMINB12, FOLATE, FERRITIN, TIBC,  IRON, RETICCTPCT in the last 72 hours. Sepsis Labs: No results for input(s): PROCALCITON, LATICACIDVEN in the last 168 hours.  Recent Results (from the past 240 hour(s))  SARS CORONAVIRUS 2 (TAT 6-24 HRS) Nasopharyngeal Nasopharyngeal Swab     Status: None   Collection Time: 05/26/19  8:52 PM   Specimen: Nasopharyngeal Swab  Result Value Ref Range Status   SARS Coronavirus 2 NEGATIVE NEGATIVE Final    Comment: (NOTE) SARS-CoV-2 target nucleic acids are NOT DETECTED. The SARS-CoV-2 RNA is generally detectable in upper and lower respiratory specimens during the acute phase of infection. Negative results do not preclude SARS-CoV-2 infection, do not rule out co-infections with other pathogens, and should not be used as the sole basis for treatment or other patient management decisions. Negative results must be combined with clinical observations, patient history, and epidemiological information. The expected result is Negative. Fact Sheet for Patients: SugarRoll.be Fact Sheet for Healthcare Providers: https://www.woods-mathews.com/ This test is not yet approved or cleared by the [place] FDA and  has been authorized for detection and/or diagnosis of SARS-CoV-2 by FDA under an Emergency Use Authorization (EUA). This EUA will remain  in effect (meaning this test can be used) for the duration of the COVID-19 declaration under Section 56 4(b)(1) of the Act, 21 U.S.C. section 360bbb-3(b)(1), unless the authorization is terminated or revoked sooner. Performed at [facility], [place] [address] [place], [place] [address]      Radiology Studies: CT ABDOMEN PELVIS W CONTRAST  Addendum Date: 05/26/2019   ADDENDUM REPORT: 05/26/2019 20:14 ADDENDUM: In the body of the report, it should state that there is a small volume of reactive free fluid in the upper abdomen. Electronically Signed   By: Constance Holster M.D.   On: 05/26/2019 20:14    Result Date: 05/26/2019 CLINICAL DATA:  Acute generalized abdominal pain. Tenderness to palpation in the left upper and left lower quadrants. Diarrhea. EXAM: CT ABDOMEN AND PELVIS WITH CONTRAST TECHNIQUE: Multidetector CT imaging of the abdomen and pelvis was performed using the standard protocol following bolus administration of intravenous contrast. CONTRAST:  171m OMNIPAQUE IOHEXOL 300 MG/ML  SOLN COMPARISON:  None. FINDINGS: Lower chest: There is atelectasis at the lung bases bilaterally.The heart size is normal. Hepatobiliary: There is decreased hepatic attenuation suggestive of hepatic steatosis. Normal gallbladder.There is no biliary ductal dilation. Pancreas: Normal contours without ductal dilatation. No peripancreatic fluid collection. Spleen: No splenic laceration or hematoma. Adrenals/Urinary Tract: --Adrenal glands: No adrenal hemorrhage. --Right kidney/ureter: No hydronephrosis or perinephric hematoma. --Left kidney/ureter: No hydronephrosis or perinephric hematoma. --Urinary bladder: The urinary bladder is decompressed which limits evaluation. Stomach/Bowel: --Stomach/Duodenum: No hiatal hernia or other gastric abnormality. Normal duodenal course and caliber. --Small bowel: There is severe diffuse wall thickening of the terminal ileum with surrounding inflammatory changes. There is no evidence for small bowel obstruction. There is a small amount of reactive free fluid in the patient's right lower quadrant and pelvis. --Colon: No focal abnormality. --Appendix: Normal. Vascular/Lymphatic: Normal course and caliber of the major abdominal  vessels. --No retroperitoneal lymphadenopathy. --No mesenteric lymphadenopathy. --No pelvic or inguinal lymphadenopathy. Reproductive: Unremarkable Other: No ascites or free air. The abdominal wall is normal. Musculoskeletal. No acute displaced fractures. IMPRESSION: 1. Severe diffuse wall thickening of the terminal ileum with surrounding inflammatory changes and  small amount of reactive free fluid in the patient's right lower quadrant and pelvis. Findings are concerning for an acute infectious or inflammatory enteritis as can be seen in Crohn's disease. There is no evidence for a small bowel obstruction. 2. Hepatic steatosis. 3. Normal appendix in the right lower quadrant. 4. Bibasilar atelectasis. Electronically Signed: By: Constance Holster M.D. On: 05/26/2019 20:06    Scheduled Meds: . dicyclomine  10 mg Oral TID AC  . enoxaparin (LOVENOX) injection  40 mg Subcutaneous Q24H  . predniSONE  20 mg Oral TID   Continuous Infusions: . ciprofloxacin Stopped (05/27/19 1124)  . metronidazole Stopped (05/27/19 1318)     LOS: 1 day   Time spent: 35 minutes.  Lorella Nimrod, MD [facility] Pager [phone]  If 7PM-7AM, please contact night-coverage [email or web address] Password [facility] 05/27/2019, 1:56 PM   This record has been created using Systems analyst. Errors have been sought and corrected,but may not always be located. Such creation errors do not reflect on the standard of care.

## 2019-05-28 DIAGNOSIS — E1110 Type 2 diabetes mellitus with ketoacidosis without coma: Secondary | ICD-10-CM

## 2019-05-28 LAB — GLUCOSE, CAPILLARY
Glucose-Capillary: 210 mg/dL — ABNORMAL HIGH (ref 70–99)
Glucose-Capillary: 208 mg/dL — ABNORMAL HIGH (ref 70–99)

## 2019-05-28 LAB — HIGH SENSITIVITY CRP: CRP, High Sensitivity: 36.87 mg/L — ABNORMAL HIGH (ref 0.00–3.00)

## 2019-05-28 MED ORDER — LIVING WELL WITH DIABETES BOOK
Freq: Once | Status: AC
  Filled 2019-05-28: qty 1

## 2019-05-28 MED ORDER — BISACODYL 5 MG PO TBEC
10.0000 mg | DELAYED_RELEASE_TABLET | Freq: Once | ORAL | Status: AC
  Administered 2019-05-28: 10 mg via ORAL
  Filled 2019-05-28: qty 2

## 2019-05-28 MED ORDER — INSULIN ASPART 100 UNIT/ML ~~LOC~~ SOLN
0.0000 [IU] | Freq: Every day | SUBCUTANEOUS | Status: DC
  Administered 2019-05-28: 2 [IU] via SUBCUTANEOUS
  Filled 2019-05-28: qty 1

## 2019-05-28 MED ORDER — PEG 3350-KCL-NA BICARB-NACL 420 G PO SOLR
4000.0000 mL | Freq: Once | ORAL | Status: AC
  Administered 2019-05-28: 4000 mL via ORAL
  Filled 2019-05-28: qty 4000

## 2019-05-28 MED ORDER — INSULIN ASPART 100 UNIT/ML ~~LOC~~ SOLN
0.0000 [IU] | Freq: Three times a day (TID) | SUBCUTANEOUS | Status: DC
  Administered 2019-05-28: 3 [IU] via SUBCUTANEOUS
  Administered 2019-05-29: 1 [IU] via SUBCUTANEOUS
  Filled 2019-05-28 (×2): qty 1

## 2019-05-28 NOTE — Progress Notes (Signed)
PROGRESS NOTE                                                                                                                                                                                                             Patient Demographics:    Kim Crawford, is a 28 y.o. female, DOB - 1990/07/22, ATF:[id number]  Admit date - 05/26/2019   Admitting Physician Neena Rhymes, MD  Outpatient Primary MD for the patient is System, Pcp Not In  LOS - 2  Outpatient Specialists: None  Chief Complaint  Patient presents with  . Abdominal Pain       Brief Narrative 28 year old female with history of PCOS,?  IBS presenting with several days of lower abdominal pain associated with nausea but no vomiting.  No associated diarrhea or constipation, fevers or chills.  CT abdomen showing findings of terminal ileitis.  Started on empiric antibiotic and GI following.   Subjective:  Had right lower quadrant pain with 9/10 intensity this morning.  Controlled with pain medication.  Denies nausea or vomiting.   Assessment  & Plan :   Principal problem Terminal ileitis ([place]) No documented history of IBS.  No associated bowel symptoms, rectal bleed.  Empiric Cipro and Flagyl.  Added Bentyl for abdominal spasms.  Percocet as needed for pain. GI consult appreciated, planned on discharging her home with 10 days of antibiotic with outpatient colonoscopy for symptoms improved versus inpatient colonoscopy.  Since her symptoms are minimally improved from yesterday I think she would benefit from getting colonoscopy as inpatient.  Will follow up with GI recommendations.   Diabetes mellitus, type II Newly diagnosed this admission with CBGs in 130s and A1c of 7.5.  Patient denies any polyuria, polydipsia or weight loss.  Will provide diabetic education, outpatient resource.  Patient instructed to establish care with PCP.  She also plans to buy a  glucometer for blood glucose monitoring.  Will place on sliding scale insulin.  Upon discharge she would benefit from being on oral hypoglycemic.  Code Status : Full code  Family Communication  : None  Disposition Plan  : Home pending clinical improvement, possible colonoscopy  Barriers For Discharge : Active symptoms  Consults  : GI  Procedures  : CT abdomen  DVT Prophylaxis  :  Lovenox -   Lab Results  Component Value Date  PLT 168 05/27/2019    Antibiotics  :   Anti-infectives (From admission, onward)   Start     Dose/Rate Route Frequency Ordered Stop   05/27/19 0800  ciprofloxacin (CIPRO) IVPB 400 mg     400 mg 200 mL/hr over 60 Minutes Intravenous Every 12 hours 05/26/19 2320     05/27/19 0600  metroNIDAZOLE (FLAGYL) IVPB 500 mg     500 mg 100 mL/hr over 60 Minutes Intravenous Every 8 hours 05/26/19 2255     05/26/19 2345  ciprofloxacin (CIPRO) IVPB 400 mg  Status:  Discontinued     400 mg 200 mL/hr over 60 Minutes Intravenous Every 12 hours 05/26/19 2255 05/26/19 2319   05/26/19 2015  ciprofloxacin (CIPRO) IVPB 400 mg     400 mg 200 mL/hr over 60 Minutes Intravenous  Once 05/26/19 2014 05/26/19 2240   05/26/19 2015  metroNIDAZOLE (FLAGYL) IVPB 500 mg     500 mg 100 mL/hr over 60 Minutes Intravenous  Once 05/26/19 2014 05/27/19 0003        Objective:   Vitals:   05/27/19 0014 05/27/19 1536 05/28/19 0137 05/28/19 0836  BP: 116/66 (!) 126/92 126/83 126/89  Pulse: 96 (!) 103 100 88  Resp: 17 16 16    Temp: 98.1 F (36.7 C) 97.8 F (36.6 C) (!) 97.5 F (36.4 C) 97.8 F (36.6 C)  TempSrc: Oral Oral    SpO2: 95% 94% 97% 97%  Weight:      Height:        Wt Readings from Last 3 Encounters:  05/26/19 72.6 kg  09/16/18 72.6 kg  08/04/18 74.8 kg     Intake/Output Summary (Last 24 hours) at 05/28/2019 1218 Last data filed at 05/28/2019 1057 Gross per 24 hour  Intake 945 ml  Output --  Net 945 ml     Physical Exam  Gen:  not in acute  distress HEENT: Supple, supple neck Chest: Clear CVs: Normal S1-S2, no murmur GI: Soft, nondistended, right lower quadrant tenderness, bowel sounds present Musculoskeletal: Warm, no edema    Data Review:    CBC Recent Labs  Lab 05/26/19 1129 05/27/19 1223  WBC 12.4* 8.3  HGB 15.6* 13.5  HCT 44.9 38.8  PLT 197 168  MCV 88.4 89.0  MCH 30.7 31.0  MCHC 34.7 34.8  RDW 11.9 12.0    Chemistries  Recent Labs  Lab 05/26/19 1129  NA 133*  K 3.8  CL 103  CO2 20*  GLUCOSE 137*  BUN 11  CREATININE 0.55  CALCIUM 9.1  AST 39  ALT 64*  ALKPHOS 73  BILITOT 1.3*   ------------------------------------------------------------------------------------------------------------------ No results for input(s): CHOL, HDL, LDLCALC, TRIG, CHOLHDL, LDLDIRECT in the last 72 hours.  Lab Results  Component Value Date   HGBA1C 7.5 (H) 05/27/2019   ------------------------------------------------------------------------------------------------------------------ No results for input(s): TSH, T4TOTAL, T3FREE, THYROIDAB in the last 72 hours.  Invalid input(s): FREET3 ------------------------------------------------------------------------------------------------------------------ No results for input(s): VITAMINB12, FOLATE, FERRITIN, TIBC, IRON, RETICCTPCT in the last 72 hours.  Coagulation profile No results for input(s): INR, PROTIME in the last 168 hours.  No results for input(s): DDIMER in the last 72 hours.  Cardiac Enzymes No results for input(s): CKMB, TROPONINI, MYOGLOBIN in the last 168 hours.  Invalid input(s): CK ------------------------------------------------------------------------------------------------------------------ No results found for: BNP  Inpatient Medications  Scheduled Meds: . Chlorhexidine Gluconate Cloth  6 each Topical Daily  . enoxaparin (LOVENOX) injection  40 mg Subcutaneous Q24H  . predniSONE  20 mg Oral TID   Continuous  Infusions: .  ciprofloxacin Stopped (05/28/19 1057)  . metronidazole 500 mg (05/28/19 0616)   PRN Meds:.acetaminophen **OR** acetaminophen, dicyclomine, iohexol, ondansetron **OR** ondansetron (ZOFRAN) IV, oxyCODONE-acetaminophen, sodium chloride flush  Micro Results Recent Results (from the past 240 hour(s))  SARS CORONAVIRUS 2 (TAT 6-24 HRS) Nasopharyngeal Nasopharyngeal Swab     Status: None   Collection Time: 05/26/19  8:52 PM   Specimen: Nasopharyngeal Swab  Result Value Ref Range Status   SARS Coronavirus 2 NEGATIVE NEGATIVE Final    Comment: (NOTE) SARS-CoV-2 target nucleic acids are NOT DETECTED. The SARS-CoV-2 RNA is generally detectable in upper and lower respiratory specimens during the acute phase of infection. Negative results do not preclude SARS-CoV-2 infection, do not rule out co-infections with other pathogens, and should not be used as the sole basis for treatment or other patient management decisions. Negative results must be combined with clinical observations, patient history, and epidemiological information. The expected result is Negative. Fact Sheet for Patients: SugarRoll.be Fact Sheet for Healthcare Providers: https://www.woods-mathews.com/ This test is not yet approved or cleared by the [place] FDA and  has been authorized for detection and/or diagnosis of SARS-CoV-2 by FDA under an Emergency Use Authorization (EUA). This EUA will remain  in effect (meaning this test can be used) for the duration of the COVID-19 declaration under Section 56 4(b)(1) of the Act, 21 U.S.C. section 360bbb-3(b)(1), unless the authorization is terminated or revoked sooner. Performed at [facility], [place] [address], [place], [place] [address]     Radiology Reports CT ABDOMEN PELVIS W CONTRAST  Addendum Date: 05/26/2019   ADDENDUM REPORT: 05/26/2019 20:14 ADDENDUM: In the body of the report, it should state that there is a small volume  of reactive free fluid in the upper abdomen. Electronically Signed   By: Constance Holster M.D.   On: 05/26/2019 20:14   Result Date: 05/26/2019 CLINICAL DATA:  Acute generalized abdominal pain. Tenderness to palpation in the left upper and left lower quadrants. Diarrhea. EXAM: CT ABDOMEN AND PELVIS WITH CONTRAST TECHNIQUE: Multidetector CT imaging of the abdomen and pelvis was performed using the standard protocol following bolus administration of intravenous contrast. CONTRAST:  181m OMNIPAQUE IOHEXOL 300 MG/ML  SOLN COMPARISON:  None. FINDINGS: Lower chest: There is atelectasis at the lung bases bilaterally.The heart size is normal. Hepatobiliary: There is decreased hepatic attenuation suggestive of hepatic steatosis. Normal gallbladder.There is no biliary ductal dilation. Pancreas: Normal contours without ductal dilatation. No peripancreatic fluid collection. Spleen: No splenic laceration or hematoma. Adrenals/Urinary Tract: --Adrenal glands: No adrenal hemorrhage. --Right kidney/ureter: No hydronephrosis or perinephric hematoma. --Left kidney/ureter: No hydronephrosis or perinephric hematoma. --Urinary bladder: The urinary bladder is decompressed which limits evaluation. Stomach/Bowel: --Stomach/Duodenum: No hiatal hernia or other gastric abnormality. Normal duodenal course and caliber. --Small bowel: There is severe diffuse wall thickening of the terminal ileum with surrounding inflammatory changes. There is no evidence for small bowel obstruction. There is a small amount of reactive free fluid in the patient's right lower quadrant and pelvis. --Colon: No focal abnormality. --Appendix: Normal. Vascular/Lymphatic: Normal course and caliber of the major abdominal vessels. --No retroperitoneal lymphadenopathy. --No mesenteric lymphadenopathy. --No pelvic or inguinal lymphadenopathy. Reproductive: Unremarkable Other: No ascites or free air. The abdominal wall is normal. Musculoskeletal. No acute displaced  fractures. IMPRESSION: 1. Severe diffuse wall thickening of the terminal ileum with surrounding inflammatory changes and small amount of reactive free fluid in the patient's right lower quadrant and pelvis. Findings are concerning for an acute infectious or inflammatory  enteritis as can be seen in Crohn's disease. There is no evidence for a small bowel obstruction. 2. Hepatic steatosis. 3. Normal appendix in the right lower quadrant. 4. Bibasilar atelectasis. Electronically Signed: By: Constance Holster M.D. On: 05/26/2019 20:06    Time Spent in minutes  25   Kerigan Narvaez M.D on 05/28/2019 at 12:18 PM  Between 7am to 7pm - Pager - [phone]  After 7pm go to [email or web address] - password [email or web address]  [facility] -  Office  [phone]

## 2019-05-28 NOTE — Progress Notes (Signed)
Kim Antigua, MD [address], [place], [place], [place], [address] [address], [place], [place], [place], [address] Phone: [phone]  Fax: [phone]   Subjective: Patient reports ongoing abdominal pain.  However does get better after she gets pain medications.  No nausea or vomiting.  Tolerated solid food diet for breakfast and lunch today.   Objective: Exam: Vital signs in last 24 hours: Vitals:   05/27/19 0014 05/27/19 1536 05/28/19 0137 05/28/19 0836  BP: 116/66 (!) 126/92 126/83 126/89  Pulse: 96 (!) 103 100 88  Resp: 17 16 16    Temp: 98.1 F (36.7 C) 97.8 F (36.6 C) (!) 97.5 F (36.4 C) 97.8 F (36.6 C)  TempSrc: Oral Oral    SpO2: 95% 94% 97% 97%  Weight:      Height:       Weight change:   Intake/Output Summary (Last 24 hours) at 05/28/2019 1425 Last data filed at 05/28/2019 1057 Gross per 24 hour  Intake 945 ml  Output --  Net 945 ml    General: No acute distress, AAO x3 Abd: Soft, NT/ND, No HSM Skin: Warm, no rashes Neck: Supple, Trachea midline   Lab Results: Lab Results  Component Value Date   WBC 8.3 05/27/2019   HGB 13.5 05/27/2019   HCT 38.8 05/27/2019   MCV 89.0 05/27/2019   PLT 168 05/27/2019   Micro Results: Recent Results (from the past 240 hour(s))  SARS CORONAVIRUS 2 (TAT 6-24 HRS) Nasopharyngeal Nasopharyngeal Swab     Status: None   Collection Time: 05/26/19  8:52 PM   Specimen: Nasopharyngeal Swab  Result Value Ref Range Status   SARS Coronavirus 2 NEGATIVE NEGATIVE Final    Comment: (NOTE) SARS-CoV-2 target nucleic acids are NOT DETECTED. The SARS-CoV-2 RNA is generally detectable in upper and lower respiratory specimens during the acute phase of infection. Negative results do not preclude SARS-CoV-2 infection, do not rule out co-infections with other pathogens, and should not be used as the sole basis for treatment or other patient management decisions. Negative results must be combined with clinical  observations, patient history, and epidemiological information. The expected result is Negative. Fact Sheet for Patients: SugarRoll.be Fact Sheet for Healthcare Providers: https://www.woods-mathews.com/ This test is not yet approved or cleared by the [place] FDA and  has been authorized for detection and/or diagnosis of SARS-CoV-2 by FDA under an Emergency Use Authorization (EUA). This EUA will remain  in effect (meaning this test can be used) for the duration of the COVID-19 declaration under Section 56 4(b)(1) of the Act, 21 U.S.C. section 360bbb-3(b)(1), unless the authorization is terminated or revoked sooner. Performed at [facility], [place] [address] [place], [place] [address]    Studies/Results: CT ABDOMEN PELVIS W CONTRAST  Addendum Date: 05/26/2019   ADDENDUM REPORT: 05/26/2019 20:14 ADDENDUM: In the body of the report, it should state that there is a small volume of reactive free fluid in the upper abdomen. Electronically Signed   By: Constance Holster M.D.   On: 05/26/2019 20:14   Result Date: 05/26/2019 CLINICAL DATA:  Acute generalized abdominal pain. Tenderness to palpation in the left upper and left lower quadrants. Diarrhea. EXAM: CT ABDOMEN AND PELVIS WITH CONTRAST TECHNIQUE: Multidetector CT imaging of the abdomen and pelvis was performed using the standard protocol following bolus administration of intravenous contrast. CONTRAST:  19m OMNIPAQUE IOHEXOL 300 MG/ML  SOLN COMPARISON:  None. FINDINGS: Lower chest: There is atelectasis at the lung bases bilaterally.The heart size is normal.  Hepatobiliary: There is decreased hepatic attenuation suggestive of hepatic steatosis. Normal gallbladder.There is no biliary ductal dilation. Pancreas: Normal contours without ductal dilatation. No peripancreatic fluid collection. Spleen: No splenic laceration or hematoma. Adrenals/Urinary Tract: --Adrenal glands: No adrenal hemorrhage.  --Right kidney/ureter: No hydronephrosis or perinephric hematoma. --Left kidney/ureter: No hydronephrosis or perinephric hematoma. --Urinary bladder: The urinary bladder is decompressed which limits evaluation. Stomach/Bowel: --Stomach/Duodenum: No hiatal hernia or other gastric abnormality. Normal duodenal course and caliber. --Small bowel: There is severe diffuse wall thickening of the terminal ileum with surrounding inflammatory changes. There is no evidence for small bowel obstruction. There is a small amount of reactive free fluid in the patient's right lower quadrant and pelvis. --Colon: No focal abnormality. --Appendix: Normal. Vascular/Lymphatic: Normal course and caliber of the major abdominal vessels. --No retroperitoneal lymphadenopathy. --No mesenteric lymphadenopathy. --No pelvic or inguinal lymphadenopathy. Reproductive: Unremarkable Other: No ascites or free air. The abdominal wall is normal. Musculoskeletal. No acute displaced fractures. IMPRESSION: 1. Severe diffuse wall thickening of the terminal ileum with surrounding inflammatory changes and small amount of reactive free fluid in the patient's right lower quadrant and pelvis. Findings are concerning for an acute infectious or inflammatory enteritis as can be seen in Crohn's disease. There is no evidence for a small bowel obstruction. 2. Hepatic steatosis. 3. Normal appendix in the right lower quadrant. 4. Bibasilar atelectasis. Electronically Signed: By: Constance Holster M.D. On: 05/26/2019 20:06   Medications:  Scheduled Meds: . Chlorhexidine Gluconate Cloth  6 each Topical Daily  . enoxaparin (LOVENOX) injection  40 mg Subcutaneous Q24H  . insulin aspart  0-5 Units Subcutaneous QHS  . insulin aspart  0-9 Units Subcutaneous TID WC  . living well with diabetes book   Does not apply Once  . predniSONE  20 mg Oral TID   Continuous Infusions: . ciprofloxacin Stopped (05/28/19 1057)  . metronidazole 500 mg (05/28/19 0616)   PRN  Meds:.acetaminophen **OR** acetaminophen, dicyclomine, iohexol, ondansetron **OR** ondansetron (ZOFRAN) IV, oxyCODONE-acetaminophen, sodium chloride flush   Assessment: Active Problems:   Ileitis, terminal (HCC)   History of IBS    Plan: Due to ongoing abdominal pain, proceed with colonoscopy tomorrow  Patient encouraged to drink 100% of her prep  If output tomorrow is watery clear to yellow/brown, okay to proceed with procedure.  However, I discussed with her that if output is not clear and watery, she may need more prep tomorrow since she ate solid food today.  I have discussed alternative options, risks & benefits,  which include, but are not limited to, bleeding, infection, perforation,respiratory complication & drug reaction.  The patient agrees with this plan & written consent will be obtained.    Dr. Marius Ditch will resume care tomorrow and proceed with colonoscopy as planned above   LOS: 2 days   Kim Antigua, MD 05/28/2019, 2:25 PM

## 2019-05-28 NOTE — Progress Notes (Signed)
Inpatient Diabetes Program Recommendations  AACE/ADA: New Consensus Statement on Inpatient Glycemic Control  Target Ranges:  Prepandial:   less than 140 mg/dL      Peak postprandial:   less than 180 mg/dL (1-2 hours)      Critically ill patients:  140 - 180 mg/dL   Results for Kim Crawford, Kim Crawford (MRN [id number]) as of 05/28/2019 13:13  Ref. Range 05/27/2019 12:23  Hemoglobin A1C Latest Ref Range: 4.8 - 5.6 % 7.5 (H)  Results for Kim Crawford, Kim Crawford (MRN [id number]) as of 05/28/2019 13:13  Ref. Range 05/26/2019 11:29  Glucose Latest Ref Range: 70 - 99 mg/dL 137 (H)   Review of Glycemic Control  Diabetes history: No Outpatient Diabetes medications: NA Current orders for Inpatient glycemic control: Novolog 0-9 units TID with meals, Novolog 0-5 units QHS  Inpatient Diabetes Program Recommendations:    HbgA1C: A1C 7.5% on 05/27/19 which indicates an average glucose of 169 mg/dl over the past 2-3 months.   NOTE: Noted consult for Inpatient Diabetes Coordinator for new onset DM. Ordered Living Well with DM book and RD consult. Will plan to talk with patient.   Addendum 05/28/19@17 :19-Spoke with patient over the phone regarding new DM dx. Patient states that she does not have a PCP at this time and she would like to have names of local providers that are taking new patients (will consult transition of care to ask they provide patient with list). Patient reports that she has never been told she had DM and she does not have any family hx of DM that she is aware of. Patient states that she has not had any blood work done in a while. Discussed A1C results (7.5% on 05/27/19) and explained what an A1C is and informed patient that current A1C indicates an average glucose of 169 mg/dl over the past 2-3 months. Discussed basic pathophysiology of DM Type 2, basic home care, importance of checking CBGs and maintaining good CBG control.  Reviewed glucose and A1C goals. Discussed impact of nutrition, exercise,  stress, sickness, and medications on diabetes control. Discussed Carb Modified diet, portion control, and encouraged patient to eliminate sugary beverages from her diet (drinks mainly [place]). Informed patient that she is ordered Prednisone which is a steroid and it is contributing to hyperglycemia.  Patient has Living Well with diabetes booklet and she states she has started to scan through it. Encouraged patient to read through entire book to continue to learn more about DM. Discussed glucose monitoring and explained that MD may have her start checking her glucose at home so she will need a Rx for glucometer and testing supplies at time of discharge. Patient states that she has a fear of needles and she does not know how she will do with being stuck for glucose monitoring or if given insulin while inpatient. Patient states she can not take insulin at home as she does not think she could self inject. Informed patient that given her A1C, she will not need insulin at home but she may be prescribed an oral DM medication at time of discharge.  Again reminded patient to be sure to establish care with a local provider so she can consistently follow up on DM control. Patient verbalized understanding of information discussed and she states that she has no questions at this time related to DM.  RNs to provide ongoing basic DM education at bedside with this patient and engage patient to actively check blood glucose.  Thanks, Barnie Alderman, RN, MSN,  CDE Diabetes Coordinator Inpatient Diabetes Program [phone] (Team Pager from 8am to 5pm)

## 2019-05-29 ENCOUNTER — Encounter: Payer: Self-pay | Admitting: Internal Medicine

## 2019-05-29 ENCOUNTER — Inpatient Hospital Stay: Payer: Medicaid Other | Admitting: Certified Registered"

## 2019-05-29 ENCOUNTER — Encounter
Admission: EM | Disposition: A | Discharge status: Home / Self Care | Payer: Self-pay | Source: Home / Self Care | Attending: Internal Medicine

## 2019-05-29 HISTORY — PX: COLONOSCOPY WITH PROPOFOL: SHX5780

## 2019-05-29 LAB — GLUCOSE, CAPILLARY
Glucose-Capillary: 91 mg/dL (ref 70–99)
Glucose-Capillary: 84 mg/dL (ref 70–99)
Glucose-Capillary: 94 mg/dL (ref 70–99)
Glucose-Capillary: 128 mg/dL — ABNORMAL HIGH (ref 70–99)
Glucose-Capillary: 185 mg/dL — ABNORMAL HIGH (ref 70–99)

## 2019-05-29 SURGERY — COLONOSCOPY WITH PROPOFOL
Anesthesia: General

## 2019-05-29 MED ORDER — MIDAZOLAM HCL 2 MG/2ML IJ SOLN
INTRAMUSCULAR | Status: AC
  Filled 2019-05-29: qty 2

## 2019-05-29 MED ORDER — PROPOFOL 500 MG/50ML IV EMUL
INTRAVENOUS | Status: AC
  Filled 2019-05-29: qty 50

## 2019-05-29 MED ORDER — MIDAZOLAM HCL 5 MG/5ML IJ SOLN
INTRAMUSCULAR | Status: DC | PRN
  Administered 2019-05-29: 2 mg via INTRAVENOUS

## 2019-05-29 MED ORDER — PROPOFOL 10 MG/ML IV BOLUS
INTRAVENOUS | Status: DC | PRN
  Administered 2019-05-29: 100 mg via INTRAVENOUS

## 2019-05-29 MED ORDER — PROPOFOL 500 MG/50ML IV EMUL
INTRAVENOUS | Status: DC | PRN
  Administered 2019-05-29: 120 ug/kg/min via INTRAVENOUS

## 2019-05-29 MED ORDER — SODIUM CHLORIDE 0.9 % IV SOLN: INTRAVENOUS | Status: DC | PRN

## 2019-05-29 NOTE — Anesthesia Post-op Follow-up Note (Signed)
Anesthesia QCDR form completed.        

## 2019-05-29 NOTE — Anesthesia Preprocedure Evaluation (Signed)
Anesthesia Evaluation  Patient identified by MRN, date of birth, ID band Patient awake    Reviewed: Allergy & Precautions, NPO status , Patient's Chart, lab work & pertinent test results  History of Anesthesia Complications Negative for: history of anesthetic complications  Airway Mallampati: II       Dental  (+) Dental Advidsory Given   Pulmonary neg sleep apnea, neg COPD, Current Smoker,    Pulmonary exam normal        Cardiovascular Exercise Tolerance: Good (-) hypertension(-) Past MI and (-) CHF Normal cardiovascular exam(-) dysrhythmias (-) Valvular Problems/Murmurs     Neuro/Psych neg Seizures    GI/Hepatic Neg liver ROS, neg GERD  ,  Endo/Other  diabetes  Renal/GU negative Renal ROS     Musculoskeletal   Abdominal   Peds  Hematology   Anesthesia Other Findings Past Medical History: 2014: Breast mass, right No date: Diabetes mellitus without complication (HCC) No date: Eczema No date: PCOS (polycystic ovarian syndrome)   Reproductive/Obstetrics                             Anesthesia Physical  Anesthesia Plan  ASA: II  Anesthesia Plan: General   Post-op Pain Management:    Induction: Intravenous  PONV Risk Score and Plan: 2 and Propofol infusion and TIVA  Airway Management Planned: Natural Airway and Nasal Cannula  Additional Equipment:   Intra-op Plan:   Post-operative Plan:   Informed Consent: I have reviewed the patients History and Physical, chart, labs and discussed the procedure including the risks, benefits and alternatives for the proposed anesthesia with the patient or authorized representative who has indicated his/her understanding and acceptance.       Plan Discussed with:   Anesthesia Plan Comments:         Anesthesia Quick Evaluation

## 2019-05-29 NOTE — Progress Notes (Addendum)
PROGRESS NOTE                                                                                                                                                                                                             Patient Demographics:    Kim Crawford, is a 28 y.o. female, DOB - 1990-12-19, VEL:[id number]  Admit date - 05/26/2019   Admitting Physician Neena Rhymes, MD  Outpatient Primary MD for the patient is System, Pcp Not In  LOS - 3  Outpatient Specialists: None  Chief Complaint  Patient presents with  . Abdominal Pain       Brief Narrative 28 year old female with history of PCOS,?  IBS presenting with several days of lower abdominal pain associated with nausea but no vomiting.  No associated diarrhea or constipation, fevers or chills.  CT abdomen showing findings of terminal ileitis.  Started on empiric antibiotic and GI following.   Subjective:  Pain is better controlled, was waiting for colonoscopy when I saw her.  Has had a lot of bowel movement all night  Assessment  & Plan :   Principal problem Terminal ileitis ([place]) No documented history of IBS.  No associated bowel symptoms, rectal bleed.  Empiric Cipro and Flagyl.  Added Bentyl for abdominal spasms.  Status post colonoscopy showing normal bowel, no IBD.  Will need Bentyl at discharge per GI.  Patient request overnight stay to monitor her blood sugar and symptoms  Diabetes mellitus, type II Newly diagnosed this admission with CBGs in 130s and A1c of 7.5.  Patient denies any polyuria, polydipsia or weight loss.  Will provide diabetic education, outpatient resource.  Patient instructed to establish care with PCP.  She also plans to buy a glucometer for blood glucose monitoring.  Will place on sliding scale insulin.  Upon discharge she would benefit from being on oral hypoglycemic.  Code Status : Full code  Family Communication  :  None  Disposition Plan  : Home pending clinical improvement, colonoscopy on 12/16 showed normal colon, no IBD  Barriers For Discharge : Patient request overnight stay to watch her blood sugar and her symptoms.  Likely discharge tomorrow morning  Consults  : GI  Procedures  : CT abdomen  DVT Prophylaxis  :  Lovenox -   Lab Results  Component Value Date   PLT 168  05/27/2019    Antibiotics  :   Anti-infectives (From admission, onward)   Start     Dose/Rate Route Frequency Ordered Stop   05/27/19 0800  [MAR Hold]  ciprofloxacin (CIPRO) IVPB 400 mg     (MAR Hold since Wed 05/29/2019 at 1350.Hold Reason: Transfer to a Procedural area.)   400 mg 200 mL/hr over 60 Minutes Intravenous Every 12 hours 05/26/19 2320     05/27/19 0600  [MAR Hold]  metroNIDAZOLE (FLAGYL) IVPB 500 mg     (MAR Hold since Wed 05/29/2019 at 1350.Hold Reason: Transfer to a Procedural area.)   500 mg 100 mL/hr over 60 Minutes Intravenous Every 8 hours 05/26/19 2255     05/26/19 2345  ciprofloxacin (CIPRO) IVPB 400 mg  Status:  Discontinued     400 mg 200 mL/hr over 60 Minutes Intravenous Every 12 hours 05/26/19 2255 05/26/19 2319   05/26/19 2015  ciprofloxacin (CIPRO) IVPB 400 mg     400 mg 200 mL/hr over 60 Minutes Intravenous  Once 05/26/19 2014 05/26/19 2240   05/26/19 2015  metroNIDAZOLE (FLAGYL) IVPB 500 mg     500 mg 100 mL/hr over 60 Minutes Intravenous  Once 05/26/19 2014 05/27/19 0003        Objective:   Vitals:   05/29/19 1407 05/29/19 1436 05/29/19 1440 05/29/19 1505  BP: 102/74 111/63 111/63 (!) 118/91  Pulse: 80 77 88   Resp: 16 17 14    Temp: 97.9 F (36.6 C) (!) 97.2 F (36.2 C)    TempSrc: Skin Temporal    SpO2: 98% 95% 96%   Weight: 72.6 kg     Height: 5' 4"  (1.626 m)       Wt Readings from Last 3 Encounters:  05/29/19 72.6 kg  09/16/18 72.6 kg  08/04/18 74.8 kg     Intake/Output Summary (Last 24 hours) at 05/29/2019 1519 Last data filed at 05/29/2019 1439 Gross per 24 hour   Intake 540 ml  Output --  Net 540 ml     Physical Exam  Gen:  not in acute distress HEENT: Supple, supple neck Chest: Clear CVs: Normal S1-S2, no murmur GI: Soft, nondistended, right lower quadrant tenderness, bowel sounds present Musculoskeletal: Warm, no edema    Data Review:    CBC Recent Labs  Lab 05/26/19 1129 05/27/19 1223  WBC 12.4* 8.3  HGB 15.6* 13.5  HCT 44.9 38.8  PLT 197 168  MCV 88.4 89.0  MCH 30.7 31.0  MCHC 34.7 34.8  RDW 11.9 12.0    Chemistries  Recent Labs  Lab 05/26/19 1129  NA 133*  K 3.8  CL 103  CO2 20*  GLUCOSE 137*  BUN 11  CREATININE 0.55  CALCIUM 9.1  AST 39  ALT 64*  ALKPHOS 73  BILITOT 1.3*   ------------------------------------------------------------------------------------------------------------------ No results for input(s): CHOL, HDL, LDLCALC, TRIG, CHOLHDL, LDLDIRECT in the last 72 hours.  Lab Results  Component Value Date   HGBA1C 7.5 (H) 05/27/2019   ------------------------------------------------------------------------------------------------------------------  ------------------------------------------------------------------------------------------------------------------ No results found for: BNP  Inpatient Medications  Scheduled Meds: . [MAR Hold] Chlorhexidine Gluconate Cloth  6 each Topical Daily  . [MAR Hold] enoxaparin (LOVENOX) injection  40 mg Subcutaneous Q24H  . [MAR Hold] insulin aspart  0-5 Units Subcutaneous QHS  . [MAR Hold] insulin aspart  0-9 Units Subcutaneous TID WC   Continuous Infusions: . [MAR Hold] ciprofloxacin 400 mg (05/29/19 0840)  . [MAR Hold] metronidazole 500 mg (05/29/19 0542)   PRN Meds:.[MAR Hold] acetaminophen **OR** [MAR  Hold] acetaminophen, [MAR Hold] dicyclomine, [MAR Hold] iohexol, [MAR Hold] ondansetron **OR** [MAR Hold] ondansetron (ZOFRAN) IV, [MAR Hold] oxyCODONE-acetaminophen, [MAR Hold] sodium chloride flush  Micro Results Recent Results (from the past  240 hour(s))  SARS CORONAVIRUS 2 (TAT 6-24 HRS) Nasopharyngeal Nasopharyngeal Swab     Status: None   Collection Time: 05/26/19  8:52 PM   Specimen: Nasopharyngeal Swab  Result Value Ref Range Status   SARS Coronavirus 2 NEGATIVE NEGATIVE Final    Comment: (NOTE) SARS-CoV-2 target nucleic acids are NOT DETECTED. The SARS-CoV-2 RNA is generally detectable in upper and lower respiratory specimens during the acute phase of infection. Negative results do not preclude SARS-CoV-2 infection, do not rule out co-infections with other pathogens, and should not be used as the sole basis for treatment or other patient management decisions. Negative results must be combined with clinical observations, patient history, and epidemiological information. The expected result is Negative. Fact Sheet for Patients: SugarRoll.be Fact Sheet for Healthcare Providers: https://www.woods-mathews.com/ This test is not yet approved or cleared by the [place] FDA and  has been authorized for detection and/or diagnosis of SARS-CoV-2 by FDA under an Emergency Use Authorization (EUA). This EUA will remain  in effect (meaning this test can be used) for the duration of the COVID-19 declaration under Section 56 4(b)(1) of the Act, 21 U.S.C. section 360bbb-3(b)(1), unless the authorization is terminated or revoked sooner. Performed at [facility], [place] [address], [place], [place] [address]     Radiology Reports CT ABDOMEN PELVIS W CONTRAST  Addendum Date: 05/26/2019   ADDENDUM REPORT: 05/26/2019 20:14 ADDENDUM: In the body of the report, it should state that there is a small volume of reactive free fluid in the upper abdomen. Electronically Signed   By: Constance Holster M.D.   On: 05/26/2019 20:14   Result Date: 05/26/2019 CLINICAL DATA:  Acute generalized abdominal pain. Tenderness to palpation in the left upper and left lower quadrants. Diarrhea. EXAM: CT  ABDOMEN AND PELVIS WITH CONTRAST TECHNIQUE: Multidetector CT imaging of the abdomen and pelvis was performed using the standard protocol following bolus administration of intravenous contrast. CONTRAST:  160m OMNIPAQUE IOHEXOL 300 MG/ML  SOLN COMPARISON:  None. FINDINGS: Lower chest: There is atelectasis at the lung bases bilaterally.The heart size is normal. Hepatobiliary: There is decreased hepatic attenuation suggestive of hepatic steatosis. Normal gallbladder.There is no biliary ductal dilation. Pancreas: Normal contours without ductal dilatation. No peripancreatic fluid collection. Spleen: No splenic laceration or hematoma. Adrenals/Urinary Tract: --Adrenal glands: No adrenal hemorrhage. --Right kidney/ureter: No hydronephrosis or perinephric hematoma. --Left kidney/ureter: No hydronephrosis or perinephric hematoma. --Urinary bladder: The urinary bladder is decompressed which limits evaluation. Stomach/Bowel: --Stomach/Duodenum: No hiatal hernia or other gastric abnormality. Normal duodenal course and caliber. --Small bowel: There is severe diffuse wall thickening of the terminal ileum with surrounding inflammatory changes. There is no evidence for small bowel obstruction. There is a small amount of reactive free fluid in the patient's right lower quadrant and pelvis. --Colon: No focal abnormality. --Appendix: Normal. Vascular/Lymphatic: Normal course and caliber of the major abdominal vessels. --No retroperitoneal lymphadenopathy. --No mesenteric lymphadenopathy. --No pelvic or inguinal lymphadenopathy. Reproductive: Unremarkable Other: No ascites or free air. The abdominal wall is normal. Musculoskeletal. No acute displaced fractures. IMPRESSION: 1. Severe diffuse wall thickening of the terminal ileum with surrounding inflammatory changes and small amount of reactive free fluid in the patient's right lower quadrant and pelvis. Findings are concerning for an acute infectious or inflammatory enteritis as can  be seen  in Crohn's disease. There is no evidence for a small bowel obstruction. 2. Hepatic steatosis. 3. Normal appendix in the right lower quadrant. 4. Bibasilar atelectasis. Electronically Signed: By: Constance Holster M.D. On: 05/26/2019 20:06    Time Spent in minutes  25   Max Sane M.D on 05/29/2019 at 3:19 PM  Between 7am to 7pm - Pager - [phone]  After 7pm go to [email or web address] - password [facility]  [facility] -  Office  [phone]

## 2019-05-29 NOTE — Plan of Care (Signed)
  RD consulted for nutrition education regarding diabetes.   Lab Results  Component Value Date   HGBA1C 7.5 (H) 05/27/2019   Met with patient at bedside. She is currently NPO awaiting procedure today but reports she has a good appetite and intake at baseline. She was eating 100% of meals prior to being made NPO. She has family members that have diabetes so she is somewhat familiar with management. She typically eats 2 meals per day (skips breakfast frequently). Patient had good questions and was very engaged in education.  RD provided "Carbohydrate Counting for People with Diabetes" handout from the Academy of Nutrition and Dietetics. Discussed different food groups and their effects on blood sugar, emphasizing carbohydrate-containing foods. Provided list of carbohydrates and recommended serving sizes of common foods.  RD provided "Using Nutrition Labels: Carbohydrates" handout from the Academy of Nutrition and Dietetics. Discussed how to read a nutrition label including looking at serving size and servings per container. Reviewed that there are 15 grams of carbohydrate in one carbohydrate choice. Encouraged patient to use chart for range of carbohydrate grams per choice when reading nutrition labels.  Discussed importance of controlled and consistent carbohydrate intake throughout the day. Provided examples of ways to balance meals/snacks and encouraged intake of high-fiber, whole grain complex carbohydrates. Teach back method used.  Expect good compliance.  Body mass index is 27.47 kg/m. Pt meets criteria for overweight based on current BMI.  Current diet order is NPO, patient was previously consuming 100% of meals before being made NPO for procedure today. Labs and medications reviewed. No further nutrition interventions warranted at this time. RD contact information provided. If additional nutrition issues arise, please re-consult RD.  Jacklynn Barnacle, MS, RD, LDN Office: [phone] Pager:  [phone] After Hours/Weekend Pager: [phone]

## 2019-05-29 NOTE — Transfer of Care (Signed)
Immediate Anesthesia Transfer of Care Note  Patient: Kim Crawford  Procedure(s) Performed: COLONOSCOPY WITH PROPOFOL (N/A )  Patient Location: Endoscopy Unit  Anesthesia Type:General  Level of Consciousness: awake and alert   Airway & Oxygen Therapy: Patient Spontanous Breathing  Post-op Assessment: Report given to RN and Post -op Vital signs reviewed and stable  Post vital signs: Reviewed  Last Vitals:  Vitals Value Taken Time  BP 111/63 05/29/19 1440  Temp 36.2 C 05/29/19 1436  Pulse 81 05/29/19 1441  Resp 16 05/29/19 1441  SpO2 95 % 05/29/19 1441  Vitals shown include unvalidated device data.  Last Pain:  Vitals:   05/29/19 1436  TempSrc: Temporal  PainSc:       Patients Stated Pain Goal: 3 (82/09/90 6893)  Complications: No apparent anesthesia complications

## 2019-05-29 NOTE — OR Nursing (Signed)
VSS. POSTOP COURSE COMPLETE. COLONOSCOPY WNL. ADVANCE TO ADA DIET. REPORT TO CHRIS ,RN ON FLOOR. TRANSPORTED BACK TO ROOM. TOLERATED CLEAR LIQUIDS

## 2019-05-29 NOTE — Op Note (Signed)
[facility] Gastroenterology Patient Name: Kim Crawford Procedure Date: 05/29/2019 2:00 PM MRN: [id number] Account #: [id number] Date of Birth: 06/29/1990 Admit Type: Outpatient Age: 28 Room: [facility] ENDO ROOM 3 Gender: Female Note Status: Finalized Procedure:             Colonoscopy Indications:           Lower abdominal pain, Abnormal CT of the GI tract Providers:             Lin Landsman MD, MD Referring MD:          No Local Md, MD (Referring MD) Medicines:             Monitored Anesthesia Care Complications:         No immediate complications. Estimated blood loss: None. Procedure:             Pre-Anesthesia Assessment:                        - Prior to the procedure, a History and Physical was                         performed, and patient medications and allergies were                         reviewed. The patient is competent. The risks and                         benefits of the procedure and the sedation options and                         risks were discussed with the patient. All questions                         were answered and informed consent was obtained.                         Patient identification and proposed procedure were                         verified by the physician, the nurse, the                         anesthesiologist, the anesthetist and the technician                         in the pre-procedure area in the procedure room in the                         endoscopy suite. Mental Status Examination: alert and                         oriented. Airway Examination: normal oropharyngeal                         airway and neck mobility. Respiratory Examination:                         clear to auscultation. CV Examination: normal.  Prophylactic Antibiotics: The patient does not require                         prophylactic antibiotics. Prior Anticoagulants: The                         patient has taken no  previous anticoagulant or                         antiplatelet agents. ASA Grade Assessment: II - A                         patient with mild systemic disease. After reviewing                         the risks and benefits, the patient was deemed in                         satisfactory condition to undergo the procedure. The                         anesthesia plan was to use monitored anesthesia care                         (MAC). Immediately prior to administration of                         medications, the patient was re-assessed for adequacy                         to receive sedatives. The heart rate, respiratory                         rate, oxygen saturations, blood pressure, adequacy of                         pulmonary ventilation, and response to care were                         monitored throughout the procedure. The physical                         status of the patient was re-assessed after the                         procedure.                        After obtaining informed consent, the colonoscope was                         passed under direct vision. Throughout the procedure,                         the patient's blood pressure, pulse, and oxygen                         saturations were monitored continuously. The  Colonoscope was introduced through the anus and                         advanced to the the terminal ileum, with                         identification of the appendiceal orifice and IC                         valve. The colonoscopy was performed without                         difficulty. The patient tolerated the procedure well.                         The quality of the bowel preparation was poor. Findings:      The perianal and digital rectal examinations were normal. Pertinent       negatives include normal sphincter tone and no palpable rectal lesions.      The terminal ileum appeared normal. Biopsies were taken with a cold        forceps for histology.      Normal mucosa was found in the entire colon.      Copious quantities of semi-liquid semi-solid stool was found in the       entire colon, precluding visualization.      The retroflexed view of the distal rectum and anal verge was normal and       showed no anal or rectal abnormalities. Impression:            - Preparation of the colon was poor.                        - The examined portion of the ileum was normal.                         Biopsied.                        - Normal mucosa in the entire examined colon.                        - Stool in the entire examined colon.                        - The distal rectum and anal verge are normal on                         retroflexion view. Recommendation:        - Return patient to hospital ward for possible                         discharge same day.                        - Diabetic (ADA) diet today.                        - Continue present medications.                        -  Await pathology results. Procedure Code(s):     --- Professional ---                        [phone], Colonoscopy, flexible; with biopsy, single or                         multiple Diagnosis Code(s):     --- Professional ---                        R10.30, Lower abdominal pain, unspecified                        R93.3, Abnormal findings on diagnostic imaging of                         other parts of digestive tract CPT copyright 2019 American Medical Association. All rights reserved. The codes documented in this report are preliminary and upon coder review may  be revised to meet current compliance requirements. Dr. Ulyess Mort Lin Landsman MD, MD 05/29/2019 2:33:15 PM This report has been signed electronically. Number of Addenda: 0 Note Initiated On: 05/29/2019 2:00 PM Scope Withdrawal Time: 0 hours 10 minutes 6 seconds  Total Procedure Duration: 0 hours 13 minutes 20 seconds  Estimated Blood Loss:  Estimated blood loss:  none.      [facility]

## 2019-05-30 ENCOUNTER — Encounter: Payer: Self-pay | Admitting: *Deleted

## 2019-05-30 DIAGNOSIS — K529 Noninfective gastroenteritis and colitis, unspecified: Secondary | ICD-10-CM

## 2019-05-30 LAB — BASIC METABOLIC PANEL
Anion gap: 9 (ref 5–15)
BUN: 14 mg/dL (ref 6–20)
CO2: 23 mmol/L (ref 22–32)
Calcium: 8.7 mg/dL — ABNORMAL LOW (ref 8.9–10.3)
Chloride: 105 mmol/L (ref 98–111)
Creatinine, Ser: 0.61 mg/dL (ref 0.44–1.00)
GFR calc Af Amer: >60 mL/min (ref >60)
GFR calc non Af Amer: >60 mL/min (ref >60)
Glucose, Bld: 122 mg/dL — ABNORMAL HIGH (ref 70–99)
Potassium: 3.9 mmol/L (ref 3.5–5.1)
Sodium: 137 mmol/L (ref 135–145)

## 2019-05-30 LAB — CBC
HCT: 36.9 % (ref 36.0–46.0)
Hemoglobin: 13.3 g/dL (ref 12.0–15.0)
MCH: 31.1 pg (ref 26.0–34.0)
MCHC: 36 g/dL (ref 30.0–36.0)
MCV: 86.4 fL (ref 80.0–100.0)
Platelets: 175 10*3/uL (ref 150–400)
RBC: 4.27 MIL/uL (ref 3.87–5.11)
RDW: 12.2 % (ref 11.5–15.5)
WBC: 10.1 10*3/uL (ref 4.0–10.5)
nRBC: 0 % (ref 0.0–0.2)

## 2019-05-30 LAB — GLUCOSE, CAPILLARY
Glucose-Capillary: 104 mg/dL — ABNORMAL HIGH (ref 70–99)
Glucose-Capillary: 105 mg/dL — ABNORMAL HIGH (ref 70–99)

## 2019-05-30 MED ORDER — METFORMIN HCL ER 500 MG PO TB24: 500.0000 mg | ORAL_TABLET | Freq: Every day | ORAL | 0 refills | Status: DC

## 2019-05-30 MED ORDER — CIPROFLOXACIN HCL 500 MG PO TABS
500.0000 mg | ORAL_TABLET | Freq: Two times a day (BID) | ORAL | Status: DC
  Filled 2019-05-30: qty 1

## 2019-05-30 MED ORDER — BLOOD GLUCOSE MONITOR KIT: PACK | 0 refills | Status: AC

## 2019-05-30 MED ORDER — DICYCLOMINE HCL 10 MG PO CAPS: 10.0000 mg | ORAL_CAPSULE | Freq: Three times a day (TID) | ORAL | 0 refills | Status: DC | PRN

## 2019-05-30 MED ORDER — METRONIDAZOLE 500 MG PO TABS
500.0000 mg | ORAL_TABLET | Freq: Three times a day (TID) | ORAL | Status: DC
  Administered 2019-05-30: 500 mg via ORAL
  Filled 2019-05-30: qty 1

## 2019-05-30 NOTE — Anesthesia Postprocedure Evaluation (Signed)
Anesthesia Post Note  Patient: Kim Crawford  Procedure(s) Performed: COLONOSCOPY WITH PROPOFOL (N/A )  Patient location during evaluation: PACU Anesthesia Type: General Level of consciousness: awake and alert Pain management: pain level controlled Vital Signs Assessment: post-procedure vital signs reviewed and stable Respiratory status: spontaneous breathing, nonlabored ventilation and respiratory function stable Cardiovascular status: blood pressure returned to baseline and stable Postop Assessment: no apparent nausea or vomiting Anesthetic complications: no     Last Vitals:  Vitals:   05/30/19 0115 05/30/19 0823  BP: (!) 110/55 112/64  Pulse: 70 (!) 56  Resp: 18 16  Temp: 36.9 C 36.7 C  SpO2: 99% 99%    Last Pain:  Vitals:   05/30/19 0223  TempSrc:   PainSc: [place]

## 2019-05-30 NOTE — Discharge Summary (Signed)
[facility] Discharge Summary   Patient: Kim Crawford LTJ:[id number]   PCP: System, Pcp Not In DOB: 03/08/1991   Date of admission: 05/26/2019   Date of discharge: 05/30/2019     Discharge Diagnoses:  Principal diagnosis Terminal ileitis   Active Problems:   Ileitis, terminal ([place])   History of IBS   Admitted From: home Disposition:  Home   Recommendations for Outpatient Follow-up:  1. PCP: establish care and follow up for DM 2. Follow up LABS/TEST:  Pathology report, no more Antibiotics on discharge.   Follow-up Information    Morayati, Lourdes Sledge, MD. Schedule an appointment as soon as possible for a visit in 1 week.   Specialty: Endocrinology Why: look at diabetes; Office stated they will call Patient w/ Appt Contact information: [place] [address] [phone]        Lin Landsman, MD. Schedule an appointment as soon as possible for a visit on 07/24/2019.   Specialty: Gastroenterology Why: Office will put Patient on Waiting List for earlier Appt Date;  @ 2:45 pm Contact information: [place] [address] [phone]        PCP In 1 week.   Why: Patient stated she is still looking for a PCP         Diet recommendation: Carb modified diet  Activity: The patient is advised to gradually reintroduce usual activities,as tolerated  Discharge Condition: good  Code Status: Full code   History of present illness: As per the H and P dictated on admission, "Kim Crawford is a 28 y.o. female with medical history significant of PCOS, unconfirmed diagnosis of IBS.  Patient reports for the last several days she has been having abdominal pain predominantly in the lower quadrants.  He described this is moderate to severe and equates it to pain of contractions.  She denies having nausea or vomiting.  Has had no hematemesis or hematochezia.  She denies diarrhea.  He has had a good appetite.  Because of her persistent  intermittent pain she presents to the [facility] ED for evaluation"  Hospital Course:  Summary of her active problems in the hospital is as following. Terminal ileitis ([place]) No documented history of IBS.  No associated bowel symptoms, rectal bleed.  Empiric Cipro and Flagyl.  finished 5 days course. Will stop Antibiotics given no more symptoms today Added Bentyl for abdominal spasms.  Status post colonoscopy showing normal bowel, no IBD.  Will need Bentyl at discharge per GI.   Diabetes mellitus, type II Newly diagnosed this admission with CBGs in 130s and A1c of 7.5.  Patient denies any polyuria, polydipsia or weight loss.  Will provide diabetic education, outpatient resource.  Patient instructed to establish care with PCP.   Will prescribe a glucometer for blood glucose monitoring.  Will place on metformin, start with low dose and ER version to avoid GI side effect  Patient was ambulatory without any assistance. On the day of the discharge the patient's vitals were stable, and no other acute medical condition were reported by patient. the patient was felt safe to be discharge at Home with no therapy needed on discharge.  Consultants: Gastroenterology  Procedures: Colonoscopy  Impression: - Preparation of the colon was poor. - The examined portion of the ileum was normal. Biopsied. - Normal mucosa in the entire examined colon. - Stool in the entire examined colon. - The distal rectum and anal verge are normal on retroflexion view. Recommendation:        -  Return patient to hospital ward for possible discharge same day. - Diabetic (ADA) diet today. - Continue present medications. - Await pathology results.  DISCHARGE MEDICATION: Allergies as of 05/30/2019      Reactions   Toradol [ketorolac Tromethamine] Other (See Comments)   "Suicide headaches"   Tramadol Other (See Comments)   Severe headaches   Penicillins Rash, Other (See Comments)   Has patient had a PCN reaction causing  immediate rash, facial/tongue/throat swelling, SOB or lightheadedness with hypotension: Unknown Has patient had a PCN reaction causing severe rash involving mucus membranes or skin necrosis: No Has patient had a PCN reaction that required hospitalization: No Has patient had a PCN reaction occurring within the last 10 years: No If all of the above answers are "NO", then may proceed with Cephalosporin use.      Medication List    TAKE these medications   blood glucose meter kit and supplies Kit Dispense based on patient and insurance preference. Use up to four times daily as directed. (FOR ICD-9 250.00, 250.01).   dicyclomine 10 MG capsule Commonly known as: Bentyl Take 1 capsule (10 mg total) by mouth 3 (three) times daily as needed for up to 14 days for spasms.   ibuprofen 200 MG tablet Commonly known as: ADVIL Take 200 mg by mouth every 6 (six) hours as needed for fever, headache or mild pain.   metFORMIN 500 MG 24 hr tablet Commonly known as: GLUCOPHAGE-XR Take 1 tablet (500 mg total) by mouth daily with breakfast.      Allergies  Allergen Reactions  . Toradol [Ketorolac Tromethamine] Other (See Comments)    "Suicide headaches"  . Tramadol Other (See Comments)    Severe headaches  . Penicillins Rash and Other (See Comments)    Has patient had a PCN reaction causing immediate rash, facial/tongue/throat swelling, SOB or lightheadedness with hypotension: Unknown Has patient had a PCN reaction causing severe rash involving mucus membranes or skin necrosis: No Has patient had a PCN reaction that required hospitalization: No Has patient had a PCN reaction occurring within the last 10 years: No If all of the above answers are "NO", then may proceed with Cephalosporin use.    Discharge Instructions    Diet - low sodium heart healthy   Complete by: As directed    Increase activity slowly   Complete by: As directed      Discharge Exam: Filed Weights   05/26/19 1123 05/29/19  1407  Weight: 72.6 kg 72.6 kg   Vitals:   05/30/19 0115 05/30/19 0823  BP: (!) 110/55 112/64  Pulse: 70 (!) 56  Resp: 18 16  Temp: 98.4 F (36.9 C) 98 F (36.7 C)  SpO2: 99% 99%   General: Appear in no distress, no Rash; Oral Mucosa Clear, moist. no Abnormal Mass Or lumps Cardiovascular: S1 and S2 Present, no Murmur, Respiratory: normal respiratory effort, Bilateral Air entry present and Clear to Auscultation, no Crackles, no wheezes Abdomen: Bowel Sound present, Soft and no tenderness, no hernia Extremities: no Pedal edema, no calf tenderness Neurology: alert and oriented to time, place, and person affect appropriate.  The results of significant diagnostics from this hospitalization (including imaging, microbiology, ancillary and laboratory) are listed below for reference.    Significant Diagnostic Studies: CT ABDOMEN PELVIS W CONTRAST  Addendum Date: 05/26/2019   ADDENDUM REPORT: 05/26/2019 20:14 ADDENDUM: In the body of the report, it should state that there is a small volume of reactive free fluid in the upper abdomen. Electronically  Signed   By: Constance Holster M.D.   On: 05/26/2019 20:14   Result Date: 05/26/2019 CLINICAL DATA:  Acute generalized abdominal pain. Tenderness to palpation in the left upper and left lower quadrants. Diarrhea. EXAM: CT ABDOMEN AND PELVIS WITH CONTRAST TECHNIQUE: Multidetector CT imaging of the abdomen and pelvis was performed using the standard protocol following bolus administration of intravenous contrast. CONTRAST:  124m OMNIPAQUE IOHEXOL 300 MG/ML  SOLN COMPARISON:  None. FINDINGS: Lower chest: There is atelectasis at the lung bases bilaterally.The heart size is normal. Hepatobiliary: There is decreased hepatic attenuation suggestive of hepatic steatosis. Normal gallbladder.There is no biliary ductal dilation. Pancreas: Normal contours without ductal dilatation. No peripancreatic fluid collection. Spleen: No splenic laceration or hematoma.  Adrenals/Urinary Tract: --Adrenal glands: No adrenal hemorrhage. --Right kidney/ureter: No hydronephrosis or perinephric hematoma. --Left kidney/ureter: No hydronephrosis or perinephric hematoma. --Urinary bladder: The urinary bladder is decompressed which limits evaluation. Stomach/Bowel: --Stomach/Duodenum: No hiatal hernia or other gastric abnormality. Normal duodenal course and caliber. --Small bowel: There is severe diffuse wall thickening of the terminal ileum with surrounding inflammatory changes. There is no evidence for small bowel obstruction. There is a small amount of reactive free fluid in the patient's right lower quadrant and pelvis. --Colon: No focal abnormality. --Appendix: Normal. Vascular/Lymphatic: Normal course and caliber of the major abdominal vessels. --No retroperitoneal lymphadenopathy. --No mesenteric lymphadenopathy. --No pelvic or inguinal lymphadenopathy. Reproductive: Unremarkable Other: No ascites or free air. The abdominal wall is normal. Musculoskeletal. No acute displaced fractures. IMPRESSION: 1. Severe diffuse wall thickening of the terminal ileum with surrounding inflammatory changes and small amount of reactive free fluid in the patient's right lower quadrant and pelvis. Findings are concerning for an acute infectious or inflammatory enteritis as can be seen in Crohn's disease. There is no evidence for a small bowel obstruction. 2. Hepatic steatosis. 3. Normal appendix in the right lower quadrant. 4. Bibasilar atelectasis. Electronically Signed: By: CConstance HolsterM.D. On: 05/26/2019 20:06    Microbiology: Recent Results (from the past 240 hour(s))  SARS CORONAVIRUS 2 (TAT 6-24 HRS) Nasopharyngeal Nasopharyngeal Swab     Status: None   Collection Time: 05/26/19  8:52 PM   Specimen: Nasopharyngeal Swab  Result Value Ref Range Status   SARS Coronavirus 2 NEGATIVE NEGATIVE Final    Comment: (NOTE) SARS-CoV-2 target nucleic acids are NOT DETECTED. The SARS-CoV-2 RNA  is generally detectable in upper and lower respiratory specimens during the acute phase of infection. Negative results do not preclude SARS-CoV-2 infection, do not rule out co-infections with other pathogens, and should not be used as the sole basis for treatment or other patient management decisions. Negative results must be combined with clinical observations, patient history, and epidemiological information. The expected result is Negative. Fact Sheet for Patients: hSugarRoll.beFact Sheet for Healthcare Providers: hhttps://www.woods-mathews.com/This test is not yet approved or cleared by the [place] and  has been authorized for detection and/or diagnosis of SARS-CoV-2 by FDA under an Emergency Use Authorization (EUA). This EUA will remain  in effect (meaning this test can be used) for the duration of the COVID-19 declaration under Section 56 4(b)(1) of the Act, 21 U.S.C. section 360bbb-3(b)(1), unless the authorization is terminated or revoked sooner. [facility]formed at [facility] [address] [place] [id number]     Labs: CBC: Recent Labs  Lab 05/26/19 1129 05/27/19 1223 05/30/19 0359  WBC 12.4* 8.3 10.1  HGB 15.6* 13.5 13.3  HCT 44.9 38.8 36.9  MCV 88.4  89.0 86.4  PLT 197 168 675   Basic Metabolic Panel: Recent Labs  Lab 05/26/19 1129 05/30/19 0359  NA 133* 137  K 3.8 3.9  CL 103 105  CO2 20* 23  GLUCOSE 137* 122*  BUN 11 14  CREATININE 0.55 0.61  CALCIUM 9.1 8.7*   Liver Function Tests: Recent Labs  Lab 05/26/19 1129  AST 39  ALT 64*  ALKPHOS 73  BILITOT 1.3*  PROT 8.2*  ALBUMIN 4.6   Recent Labs  Lab 05/26/19 1129  LIPASE 22   No results for input(s): AMMONIA in the last 168 hours. Cardiac Enzymes: No results for input(s): CKTOTAL, CKMB, CKMBINDEX, TROPONINI in the last 168 hours. BNP (last 3 results) No results for input(s): BNP in the last 8760 hours. CBG: Recent Labs    Lab 05/29/19 1405 05/29/19 1711 05/29/19 2041 05/30/19 0822 05/30/19 1132  GLUCAP 94 128* 185* 104* 105*    Time spent: 35 minutes  Signed:  Berle Mull  [facility] 05/30/2019 5:04 PM

## 2019-05-30 NOTE — Progress Notes (Signed)
Pt educated on discharge instructions and verbalized understanding. Midline removed. VSS. Wheeled to medical entrance to family vehicle.

## 2019-05-31 LAB — SURGICAL PATHOLOGY

## 2019-06-03 DIAGNOSIS — E119 Type 2 diabetes mellitus without complications: Secondary | ICD-10-CM | POA: Insufficient documentation

## 2019-07-24 ENCOUNTER — Ambulatory Visit: Payer: Medicaid Other | Admitting: Gastroenterology

## 2019-07-24 ENCOUNTER — Encounter: Payer: Self-pay | Admitting: *Deleted

## 2019-08-12 ENCOUNTER — Telehealth: Payer: Self-pay | Admitting: Surgery

## 2019-08-12 NOTE — Telephone Encounter (Signed)
Patient states that she has a new bump on her backside that has gotten bigger over the last few days. Very painful and concerned for infection. She will come in tomorrow to be seen by Dr Christian Mate.

## 2019-08-12 NOTE — Telephone Encounter (Signed)
Patient is calling said she saw Dr. Dahlia Byes in the past, patient was seen for pilonidal cyst, and is c/o having a knot on her back side. Patient doesn't have a pcp and said she thought to call us. Please call patient and advise.

## 2019-08-13 ENCOUNTER — Encounter: Payer: Self-pay | Admitting: Surgery

## 2019-08-13 ENCOUNTER — Encounter: Payer: Self-pay | Admitting: Emergency Medicine

## 2019-08-13 ENCOUNTER — Other Ambulatory Visit: Payer: Self-pay

## 2019-08-13 ENCOUNTER — Ambulatory Visit (INDEPENDENT_AMBULATORY_CARE_PROVIDER_SITE_OTHER): Payer: Medicaid Other | Admitting: Surgery

## 2019-08-13 VITALS — BP 121/80 | HR 94 | Temp 97.1°F | Resp 12 | Ht 65.0 in | Wt 198.8 lb

## 2019-08-13 DIAGNOSIS — M533 Sacrococcygeal disorders, not elsewhere classified: Secondary | ICD-10-CM

## 2019-08-13 NOTE — Patient Instructions (Addendum)
Your appointment for your MRI of the Sacrum with without Contrast is on 08/20/19 at 7pm arrival at 6:30pm at the [facility].  Dr Christian Mate wants you to follow up with Dr Dahlia Byes after you have your MRI done. Please see you appointment below.   Please call the office if you have any questions or concerns.   Acute Back Pain, Adult Acute back pain is sudden and usually short-lived. It is often caused by an injury to the muscles and tissues in the back. The injury may result from:  A muscle or ligament getting overstretched or torn (strained). Ligaments are tissues that connect bones to each other. Lifting something improperly can cause a back strain.  Wear and tear (degeneration) of the spinal disks. Spinal disks are circular tissue that provides cushioning between the bones of the spine (vertebrae).  Twisting motions, such as while playing sports or doing yard work.  A hit to the back.  Arthritis. You may have a physical exam, lab tests, and imaging tests to find the cause of your pain. Acute back pain usually goes away with rest and home care. Follow these instructions at home: Managing pain, stiffness, and swelling  Take over-the-counter and prescription medicines only as told by your health care provider.  Your health care provider may recommend applying ice during the first 24-48 hours after your pain starts. To do this: ? Put ice in a plastic bag. ? Place a towel between your skin and the bag. ? Leave the ice on for 20 minutes, 2-3 times a day.  If directed, apply heat to the affected area as often as told by your health care provider. Use the heat source that your health care provider recommends, such as a moist heat pack or a heating pad. ? Place a towel between your skin and the heat source. ? Leave the heat on for 20-30 minutes. ? Remove the heat if your skin turns bright red. This is especially important if you are unable to feel pain, heat, or cold. You have a greater risk of  getting burned. Activity   Do not stay in bed. Staying in bed for more than 1-2 days can delay your recovery.  Sit up and stand up straight. Avoid leaning forward when you sit, or hunching over when you stand. ? If you work at a desk, sit close to it so you do not need to lean over. Keep your chin tucked in. Keep your neck drawn back, and keep your elbows bent at a right angle. Your arms should look like the letter "L." ? Sit high and close to the steering wheel when you drive. Add lower back (lumbar) support to your car seat, if needed.  Take short walks on even surfaces as soon as you are able. Try to increase the length of time you walk each day.  Do not sit, drive, or stand in one place for more than 30 minutes at a time. Sitting or standing for long periods of time can put stress on your back.  Do not drive or use heavy machinery while taking prescription pain medicine.  Use proper lifting techniques. When you bend and lift, use positions that put less stress on your back: ? [place] your knees. ? Keep the load close to your body. ? Avoid twisting.  Exercise regularly as told by your health care provider. Exercising helps your back heal faster and helps prevent back injuries by keeping muscles strong and flexible.  Work with a physical therapist  to make a safe exercise program, as recommended by your health care provider. Do any exercises as told by your physical therapist. Lifestyle  Maintain a healthy weight. Extra weight puts stress on your back and makes it difficult to have good posture.  Avoid activities or situations that make you feel anxious or stressed. Stress and anxiety increase muscle tension and can make back pain worse. Learn ways to manage anxiety and stress, such as through exercise. General instructions  Sleep on a firm mattress in a comfortable position. Try lying on your side with your knees slightly bent. If you lie on your back, put a pillow under your  knees.  Follow your treatment plan as told by your health care provider. This may include: ? Cognitive or behavioral therapy. ? Acupuncture or massage therapy. ? Meditation or yoga. Contact a health care provider if:  You have pain that is not relieved with rest or medicine.  You have increasing pain going down into your legs or buttocks.  Your pain does not improve after 2 weeks.  You have pain at night.  You lose weight without trying.  You have a fever or chills. Get help right away if:  You develop new bowel or bladder control problems.  You have unusual weakness or numbness in your arms or legs.  You develop nausea or vomiting.  You develop abdominal pain.  You feel faint. Summary  Acute back pain is sudden and usually short-lived.  Use proper lifting techniques. When you bend and lift, use positions that put less stress on your back.  Take over-the-counter and prescription medicines and apply heat or ice as directed by your health care provider. This information is not intended to replace advice given to you by your health care provider. Make sure you discuss any questions you have with your health care provider. Document Revised: 09/18/2018 Document Reviewed: 01/11/2017 Elsevier Patient Education  [place].

## 2019-08-13 NOTE — Progress Notes (Signed)
Surgical Clinic Progress Note   HPI:  29 y.o. Female presents to clinic for concerns regarding possible infection of sacral region with history of prior pilonidal surgery.  This patient had surgery with Dr. Dahlia Byes I believe in 2019.  She reports no discharge from the region, no fevers or chills.  She does report some pain and she points to the right sacroiliac joint area.  She reports this is been present for about a week, felt to be exacerbated by the couch she has been sitting on.   Seems to be exacerbated by certain movements.  Was concerned her pilonidal infection had come back.  Review of Systems:  Constitutional: denies fever/chills  ENT: denies sore throat, hearing problems  Respiratory: denies shortness of breath, wheezing  Cardiovascular: denies chest pain, palpitations  Gastrointestinal: denies abdominal pain, N/V, or diarrhea/and bowel function as per interval history Skin: Denies any other rashes or skin discolorations except post-surgical wounds as per interval history  Vital Signs:  BP 121/80   Pulse 94   Temp (!) 97.1 F (36.2 C)   Resp 12   Ht 5' 5"  (1.651 m)   Wt 198 lb 12.8 oz (90.2 kg)   SpO2 96%   BMI 33.08 kg/m    Physical Exam:  Constitutional:  -- Normal/Obese body habitus  -- Awake, alert, and oriented x3  Pulmonary:  -- No crackles -- Equal breath sounds bilaterally -- Breathing non-labored at rest Cardiovascular:  -- S1, S2 present  -- No pericardial rubs  Gastrointestinal:  -- Soft and non-distended, non-tender/with no tenderness to palpation, no guarding/rebound tenderness -- Post-surgical incisions all well-healed without any drainage --  GU  --pilonidal wound well covered with healthy skin, Musculoskeletal / Integumentary:  ---- Extremities: B/L UE and LE FROM, hands and feet warm, no edema   Right SI joint area is unremarkable, tenderness located over the joint.  No skin changes appreciable.  About 10 cm away is her well-healed post pilonidal  scar.  I do not believe there is any relationship.  Laboratory studies: None current  Imaging: No new pertinent imaging available for review   Assessment:  29 y.o. yo Female with a problem list including...  Patient Active Problem List   Diagnosis Date Noted  . Diabetes mellitus without complication ([place]) 90/24/0973  . 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 History of IBS 05/26/2019  . Pilonidal cyst   . Sepsis ([place]) 01/03/2018  . Cellulitis 01/03/2018    presents to clinic for follow-up evaluation of right-sided sacroiliac pain for 1 week.  Plan:              - return to clinic, following MRI of sacrum to evaluate for sacroiliac pain/source of pain.  All of the above recommendations were discussed with the patient and patient's family, and all of patient's and family's questions were answered to his/her/their expressed satisfaction.  Ronny Bacon, MD, FACS [place]: [facility] exceptional care. Office: [phone]

## 2019-08-20 ENCOUNTER — Ambulatory Visit: Payer: Medicaid Other

## 2019-08-26 ENCOUNTER — Ambulatory Visit: Payer: Self-pay | Admitting: Surgery

## 2019-09-06 ENCOUNTER — Other Ambulatory Visit: Payer: Self-pay

## 2019-09-06 ENCOUNTER — Other Ambulatory Visit (HOSPITAL_COMMUNITY): Payer: Self-pay | Admitting: Physician Assistant

## 2019-09-06 ENCOUNTER — Other Ambulatory Visit: Payer: Self-pay | Admitting: Physician Assistant

## 2019-09-06 ENCOUNTER — Ambulatory Visit
Admission: RE | Admit: 2019-09-06 | Discharge: 2019-09-06 | Disposition: A | Discharge status: Home / Self Care | Payer: Medicaid Other | Source: Ambulatory Visit | Attending: Physician Assistant | Admitting: Physician Assistant

## 2019-09-06 DIAGNOSIS — R109 Unspecified abdominal pain: Secondary | ICD-10-CM | POA: Insufficient documentation

## 2019-09-06 DIAGNOSIS — R1032 Left lower quadrant pain: Secondary | ICD-10-CM

## 2019-10-16 ENCOUNTER — Other Ambulatory Visit: Payer: Self-pay

## 2019-10-16 ENCOUNTER — Encounter: Payer: Medicaid Other | Attending: Internal Medicine | Admitting: *Deleted

## 2019-10-16 ENCOUNTER — Encounter: Payer: Self-pay | Admitting: *Deleted

## 2019-10-16 VITALS — BP 122/80 | Ht 65.0 in | Wt 198.0 lb

## 2019-10-16 DIAGNOSIS — E782 Mixed hyperlipidemia: Secondary | ICD-10-CM | POA: Insufficient documentation

## 2019-10-16 DIAGNOSIS — E119 Type 2 diabetes mellitus without complications: Secondary | ICD-10-CM | POA: Diagnosis not present

## 2019-10-16 DIAGNOSIS — F17210 Nicotine dependence, cigarettes, uncomplicated: Secondary | ICD-10-CM | POA: Insufficient documentation

## 2019-10-16 NOTE — Progress Notes (Signed)
Diabetes Self-Management Education  Visit Type: First/Initial  Appt. Start Time: 0840 Appt. End Time: 9371  10/16/2019  Kim Crawford, identified by name and date of birth, is a 29 y.o. female with a diagnosis of Diabetes: Type 2.   ASSESSMENT  Blood pressure 122/80, height 5' 5"  (1.651 m), weight 198 lb (89.8 kg). Body mass index is 32.95 kg/m.  Diabetes Self-Management Education - 10/16/19 1003      Visit Information   Visit Type  First/Initial      Initial Visit   Diabetes Type  Type 2    Are you currently following a meal plan?  Yes    What type of meal plan do you follow?  "trying to change eating habits" - decreased sweet tea, using less salad dressing    Are you taking your medications as prescribed?  Yes    Date Diagnosed  6-8 months ago      Health Coping   How would you rate your overall health?  Fair      Psychosocial Assessment   Patient Belief/Attitude about Diabetes  Other (comment)   "something I am still processing"   Self-care barriers  None    Self-management support  Doctor's office;Family    Other persons present  Family Member   Mom stayed for 15 minutes   Patient Concerns  Nutrition/Meal planning;Glycemic Control;Monitoring;Weight Control    Special Needs  None    Preferred Learning Style  Visual;Hands on    Learning Readiness  Change in progress    How often do you need to have someone help you when you read instructions, pamphlets, or other written materials from your doctor or pharmacy?  1 - Never    What is the last grade level you completed in school?  12th      Pre-Education Assessment   Patient understands the diabetes disease and treatment process.  Needs Instruction    Patient understands incorporating nutritional management into lifestyle.  Needs Instruction    Patient undertands incorporating physical activity into lifestyle.  Needs Instruction    Patient understands using medications safely.  Needs Instruction    Patient  understands monitoring blood glucose, interpreting and using results  Needs Review    Patient understands prevention, detection, and treatment of acute complications.  Needs Instruction    Patient understands prevention, detection, and treatment of chronic complications.  Needs Instruction    Patient understands how to develop strategies to address psychosocial issues.  Needs Instruction    Patient understands how to develop strategies to promote health/change behavior.  Needs Instruction      Complications   Last HgB A1C per patient/outside source  7.5 %   09/02/2019   How often do you check your blood sugar?  0 times/day (not testing)   has a meter but hasn't checked blood sugars in last 2 weeks. BG today in the office was 181 mg/dL at 9:40 am - 2 hrs pp.   Number of hypoglycemic episodes per month  --   Reported feeling shaky and blood sugar was 120's mg/dL.   Have you had a dilated eye exam in the past 12 months?  No    Have you had a dental exam in the past 12 months?  No    Are you checking your feet?  No      Dietary Intake   Breakfast  skips or has a supplement smoothie    Snack (morning)  pt reports having 5 snacks/day - chips, protein  bar, fruit bar, pasta salad    Lunch  salads, sub - [place] or veggie; chips    Dinner  chicken, beef, pork; potatoes, peas, beans, corn, pasta, broccoli, green beans, lettuce, carrots, cuccumbers, squash, zuchinni, okra    Beverage(s)  water, diet soda, 1/2 and 1/2 sweet tea      Exercise   Exercise Type  ADL's      Patient Education   Previous Diabetes Education  No    Disease state   Definition of diabetes, type 1 and 2, and the diagnosis of diabetes;Factors that contribute to the development of diabetes    Nutrition management   Role of diet in the treatment of diabetes and the relationship between the three main macronutrients and blood glucose level;Food label reading, portion sizes and measuring food.;Reviewed blood glucose goals for pre  and post meals and how to evaluate the patients' food intake on their blood glucose level.;Meal timing in regards to the patients' current diabetes medication.    Physical activity and exercise   Role of exercise on diabetes management, blood pressure control and cardiac health.    Medications  Reviewed patients medication for diabetes, action, purpose, timing of dose and side effects.    Monitoring  Purpose and frequency of SMBG.;Taught/discussed recording of test results and interpretation of SMBG.;Identified appropriate SMBG and/or A1C goals.    Chronic complications  Relationship between chronic complications and blood glucose control;Retinopathy and reason for yearly dilated eye exams    Psychosocial adjustment  Identified and addressed patients feelings and concerns about diabetes    Personal strategies to promote health  Review risk of smoking and offered smoking cessation      Individualized Goals (developed by patient)   Reducing Risk  Other (comment)   improve blood sugars, prevent diabetes complications, lose weight, quit smoking     Outcomes   Expected Outcomes  Demonstrated interest in learning. Expect positive outcomes       Individualized Plan for Diabetes Self-Management Training:   Learning Objective:  Patient will have a greater understanding of diabetes self-management. Patient education plan is to attend individual and/or group sessions per assessed needs and concerns.   Plan:   Patient Instructions  Check blood sugars 1 x day before breakfast or 2 hrs after one meal every day Bring blood sugar records to the next class Exercise: Begin walking for 10-15  minutes 3 days a week and increase gradually increase to 30 minutes 5 x week Eat 3 meals day,  2-3  snacks a day Space meals 4-6 hours apart Don't skip meals Allow 2-3 hours between meals and snacks Avoid sugar sweetened drinks (tea) Quit smoking Make an eye doctor appointment Carry fast acting glucose and a  snack at all times  Expected Outcomes:  Demonstrated interest in learning. Expect positive outcomes  Education material provided: General Meal Planning Guidelines Simple Meal Plan Glucose tablets Symptoms, causes and treatments of Hypoglycemia  If problems or questions, patient to contact team via:  Johny Drilling, RN, [place], [place] [phone]  Future DSME appointment:  The patient will check her calendar and call back to schedule Diabetes classes.

## 2019-10-16 NOTE — Patient Instructions (Signed)
Check blood sugars 1 x day before breakfast or 2 hrs after one meal every day Bring blood sugar records to the next class  Exercise: Begin walking for 10-15  minutes 3 days a week and increase gradually increase to 30 minutes 5 x week  Eat 3 meals day,  2-3  snacks a day Space meals 4-6 hours apart Don't skip meals Allow 2-3 hours between meals and snacks Avoid sugar sweetened drinks (tea)  Quit smoking  Make an eye doctor appointment  Carry fast acting glucose and a snack at all times  Return for classes on:

## 2019-10-31 ENCOUNTER — Telehealth: Payer: Self-pay | Admitting: *Deleted

## 2019-10-31 NOTE — Telephone Encounter (Signed)
Phone call to patient about scheduling Diabetes classes. She has been on vacation and reports that she lost the schedule for classes. Will email her a list.

## 2019-12-05 ENCOUNTER — Encounter: Payer: Self-pay | Admitting: *Deleted

## 2020-05-11 IMAGING — CT CT RENAL STONE PROTOCOL
2 of 3 series · 9 of 46 positions shown, 11 images · non-contrast
Comparison: May 26, 2019

CLINICAL DATA: Left flank pain for 3 days

EXAM:
CT ABDOMEN AND PELVIS WITHOUT CONTRAST
TECHNIQUE: Multidetector CT imaging of the abdomen and pelvis was performed
following the standard protocol without IV contrast.

[Series 5: coronal · coronal · 0.82mm/px · 8 of 150 slices shown, 9 images]
[im 17/150  soft-tissue]
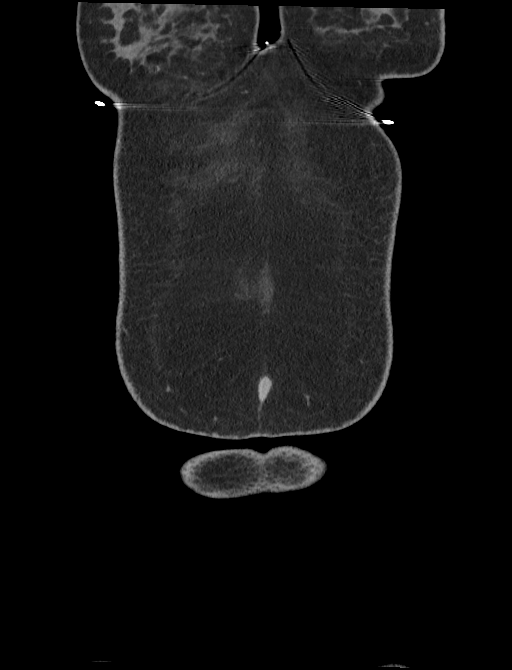
[im 17/150  bone]
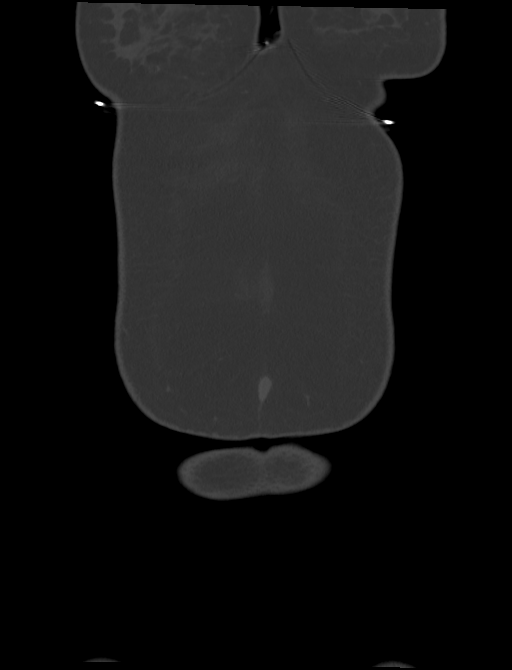
[im 34/150  soft-tissue]
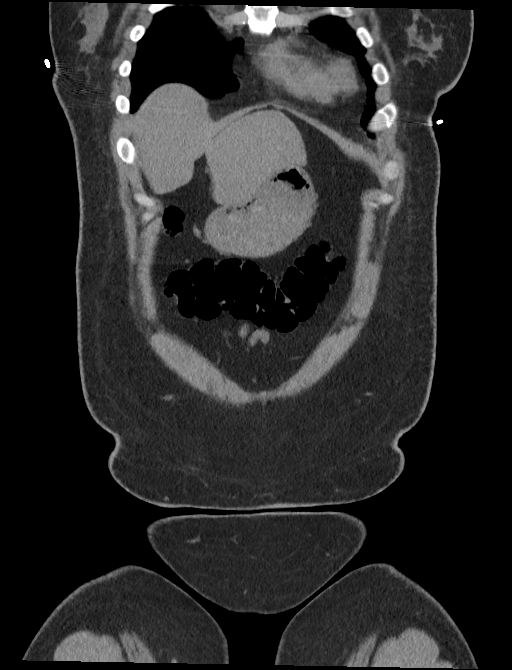
[im 50/150  soft-tissue]
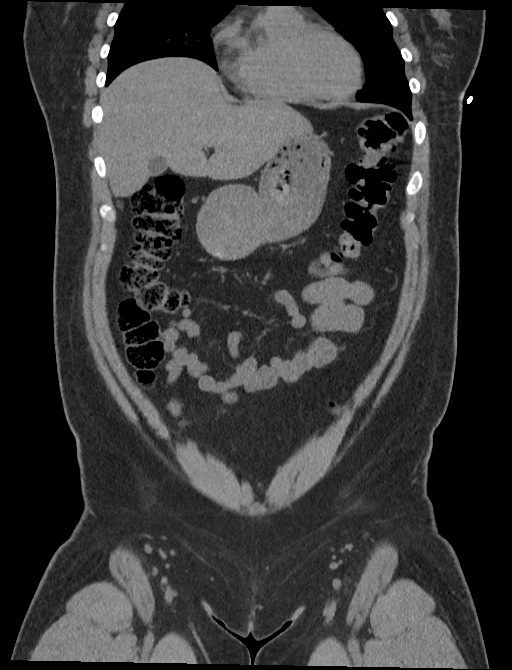
[im 67/150  soft-tissue]
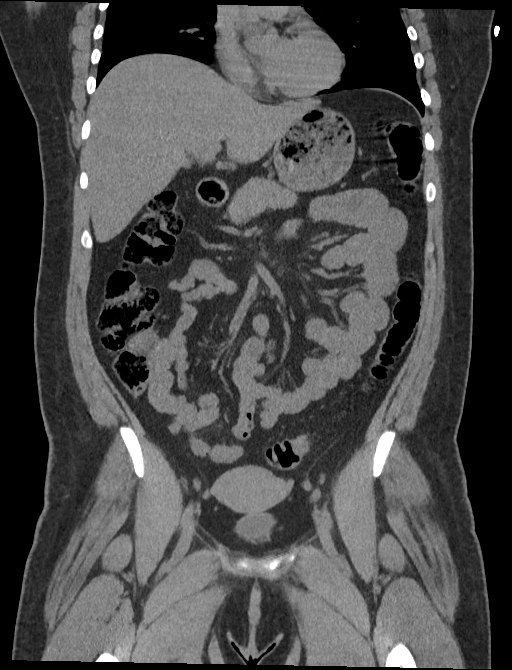
[im 83/150  soft-tissue]
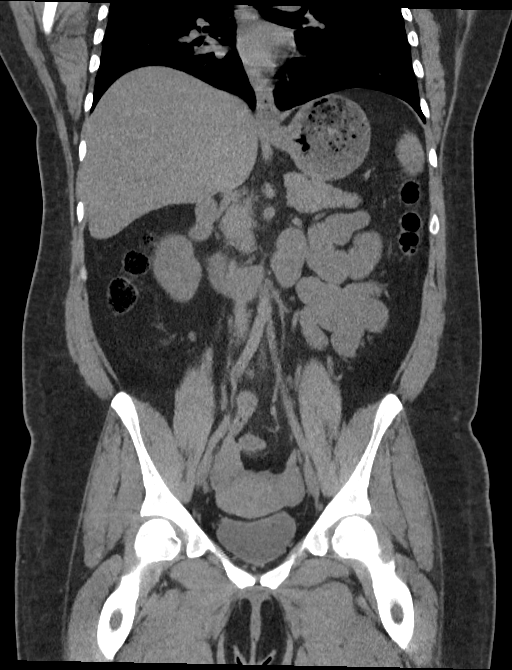
[im 100/150  soft-tissue]
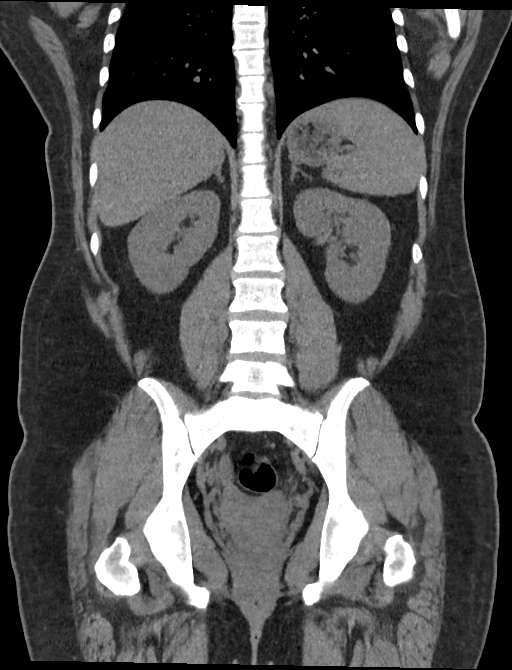
[im 116/150  soft-tissue]
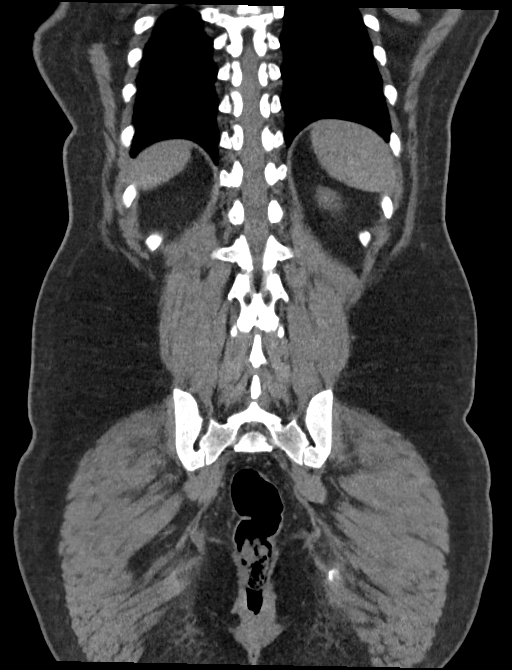
[im 133/150  soft-tissue]
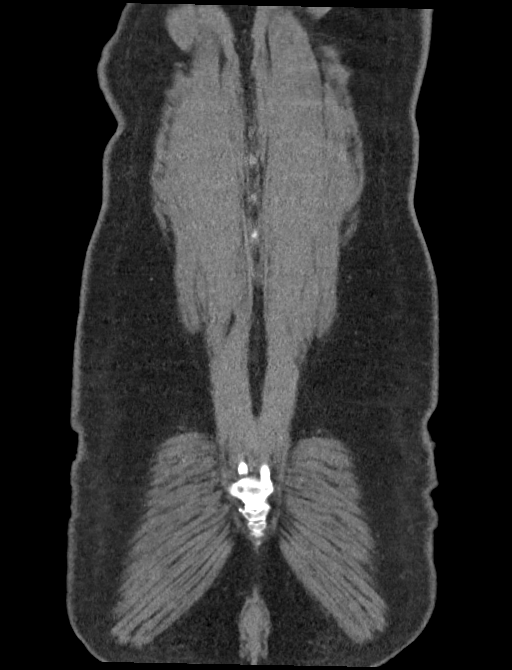

[Series 6: sagittal · sagittal · 0.60mm/px · 1 of 206 slices shown, 2 images]
[im 69/206  soft-tissue]
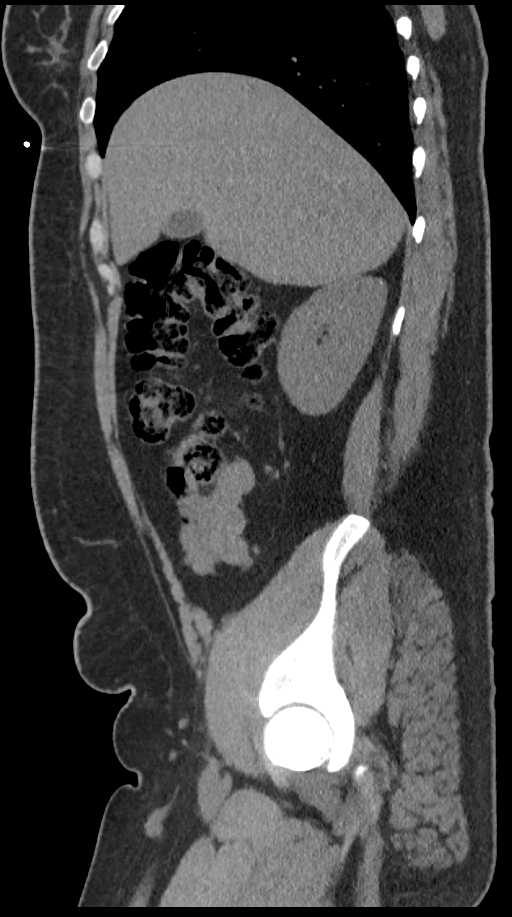
[im 69/206  bone]
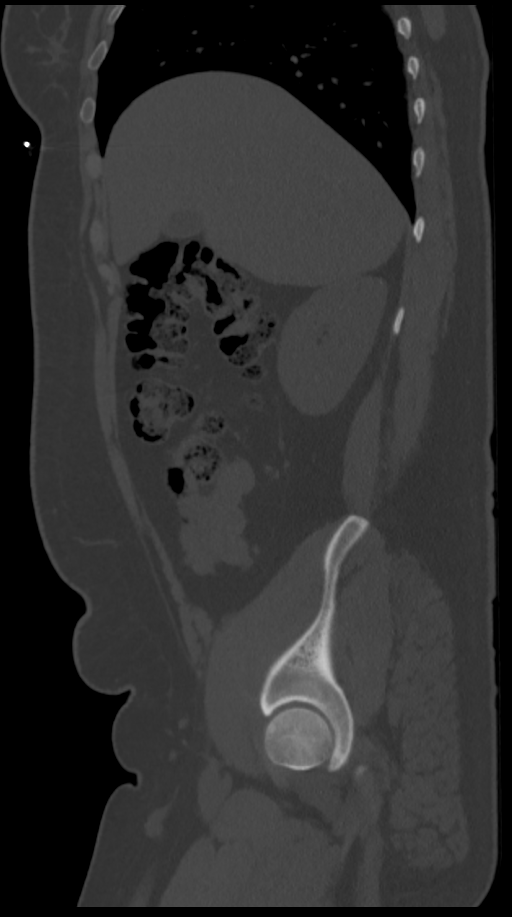

[9 of 46 positions shown; findings below may reference images not displayed]

FINDINGS: Lower chest: Mild scar is identified in the medial right lung base
unchanged. The heart size is normal.

Hepatobiliary: No focal liver abnormality is seen. No gallstones,
gallbladder wall thickening, or biliary dilatation.

Pancreas: Unremarkable. No pancreatic ductal dilatation or
surrounding inflammatory changes.

Spleen: Normal in size without focal abnormality.

Adrenals/Urinary Tract: Adrenal glands are unremarkable. Kidneys are
normal, without renal calculi, focal lesion, or hydronephrosis.
Bladder is unremarkable.

Stomach/Bowel: Stomach is within normal limits. Appendix appears
normal. No evidence of bowel wall thickening, distention, or
inflammatory changes.

Vascular/Lymphatic: No significant vascular findings are present. No
enlarged abdominal or pelvic lymph nodes.

Reproductive: Uterus and bilateral adnexa are unremarkable.

Other: None.

Musculoskeletal: No acute or significant osseous findings.
IMPRESSION: No acute abnormality identified in the abdomen and pelvis. There is
no nephrolithiasis or hydroureteronephrosis bilaterally.

## 2020-10-02 ENCOUNTER — Other Ambulatory Visit: Payer: Self-pay | Admitting: Internal Medicine

## 2020-10-02 DIAGNOSIS — N644 Mastodynia: Secondary | ICD-10-CM

## 2021-09-13 NOTE — H&P (Signed)
Kim Crawford is a 31 y.o. female here cervical LEEP  ?.pathology from [facility] 09/01/21: ? 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  ?Specimen B-Cervical Biopsy, 1:00: CHRONIC CERVICITIS AND  ?SQUAMOUS METAPLASIA.  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  CHRONIC CERVICITIS AND SQUAMOUS METAPLASIA.  ?Specimen D-Cervical Biopsy, 6:00: KOILOCYTOSIS SUGGESTIVE  ?OF HUMAN PAPILLOMAVIRUS (HPV) EFFECT.  CHRONIC CERVICITIS  ?AND SQUAMOUS METAPLASIA.  ?Specimen E-Cervical Biopsy, 10:00: HIGH-GRADE SQUAMOUS  ?INTRAEPITHELIAL LESION (CIN 2).  TRANSFORMATION ZONE NOT  ?REPRESENTED. ?  ?Pt is trying to conceive  ?  ?Past Medical History:  has a past medical history of Diabetes mellitus without complication (CMS-HCC).  ?Past Surgical History:  has a past surgical history that includes Cesarean section. ?Family History: family history is not on file. She was adopted. ?Social History:  reports that she has been smoking cigarettes. She has never used smokeless tobacco. She reports current alcohol use. She reports that she does not use drugs. ?OB/GYN History:  ?        ?OB History   ?  Gravida  ?1  ? Para  ?1  ? Term  ?1  ? Preterm  ?   ? AB  ?   ? Living  ?1  ?  ?  SAB  ?   ? IAB  ?   ? Ectopic  ?   ? Molar  ?   ? Multiple  ?   ? Live Births  ?1  ?  ?  ?   ?  ?  ?Allergies: is allergic to penicillins, ketorolac tromethamine, tramadol, (d)-limonene flavor, and hydrocodone. ?Medications: ?  ?Current Outpatient Medications:  ?  blood-glucose sensor (DEXCOM G6 SENSOR) Devi, Use 1 Device continuously for 10 days (Patient not taking: Reported on 07/30/2021), Disp: 3 each, Rfl: 3 ?  canagliflozin (INVOKANA) 300 mg tablet, Take 1 tablet (300 mg total) by mouth every morning before breakfast, Disp: 30 tablet, Rfl: 11 ?  flash glucose sensor (FREESTYLE  LIBRE 14 DAY SENSOR) kit, Use 1 kit every 14 (fourteen) days for glucose monitoring, Disp: 1 kit, Rfl: 3 ?  gemfibroziL (LOPID) 600 mg tablet, Take 1 tablet (600 mg total) by mouth 2 (two) times daily before meals, Disp: 60 tablet, Rfl: 11 ?  metFORMIN (GLUCOPHAGE) 500 MG tablet, Take 1 tablet (500 mg total) by mouth 2 (two) times daily with meals, Disp: 60 tablet, Rfl: 11 ?  prenatal vit-iron fum-folic ac (PRENAVITE) tablet, Take 1 tablet by mouth once daily, Disp: , Rfl:  ?  propranoloL (INDERAL) 20 MG tablet, Take 1 tablet (20 mg total) by mouth 2 (two) times daily, Disp: 60 tablet, Rfl: 11 ?  ?Review of Systems: ?General:                      No fatigue or weight loss ?Eyes:                           No vision changes ?Ears:                            No hearing difficulty ?Respiratory:                No cough or shortness of breath ?Pulmonary:  No asthma or shortness of breath ?Cardiovascular:           No chest pain, palpitations, dyspnea on exertion ?Gastrointestinal:          No abdominal bloating, chronic diarrhea, constipations, masses, pain or hematochezia ?Genitourinary:             No hematuria, dysuria, abnormal vaginal discharge, pelvic pain, Menometrorrhagia ?Lymphatic:                   No swollen lymph nodes ?Musculoskeletal:         No muscle weakness ?Neurologic:                  No extremity weakness, syncope, seizure disorder ?Psychiatric:                  No history of depression, delusions or suicidal/homicidal ideation ?  ?  ? Exam:  ?  ?   ?Vitals:  ?  09/01/21 1002  ?BP: 128/86  ?Pulse: 96  ?  ?  ?Body mass index is 32.28 kg/m?. ?  ?WDWN white/  female in NAD   ?Lungs: CTA  ?CV : RRR without murmur   ?  ?Pelvic: tanner stage 5 ,  ?External genitalia: vulva /labia no lesions ?Urethra: no prolapse ?Vagina: normal physiologic d/c, adequate room for LAVH if need be  ?Cervix: no lesions, no cervical motion tenderness   ?Uterus: normal size shape and contour, non-tender ?Adnexa:  no mass,  non-tender   ?Rectovaginal: ?Colposcopic exam: ?A speculum is placed and 5% acetic acid is applied to the cervix. There is acetowhite epithelium at 10-2, 6-8o?clock. There is no  puncation .  There is mosacism at 10-2 oclock. Representative biopsies are done at 10/1/6/7 oclock.  An ECC is also done. Monsel solution is applied with good hemostasis. ?  ?Impression:  ?  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 ?  ?Plan:  ?  ?Pt is scheduled for cervical LEEP , shallow hat and ECC  ?  ? risks dicussed . See [facility] notes . Risks of future incompetent cervix  ?  ?  ?No follow-ups on file. ?  ?Caroline Sauger, MD ?  ?

## 2021-09-15 ENCOUNTER — Other Ambulatory Visit
Admission: RE | Admit: 2021-09-15 | Discharge: 2021-09-15 | Disposition: A | Discharge status: Home / Self Care | Payer: Medicaid Other | Source: Ambulatory Visit | Attending: Obstetrics and Gynecology | Admitting: Obstetrics and Gynecology

## 2021-09-15 ENCOUNTER — Other Ambulatory Visit: Payer: Self-pay

## 2021-09-15 HISTORY — DX: Essential (primary) hypertension: I10

## 2021-09-15 HISTORY — DX: Headache, unspecified: R51.9

## 2021-09-15 NOTE — Patient Instructions (Addendum)
Your procedure is scheduled on: 09/23/21 - Thursday ?Report to the Registration Desk on the 1st floor of the [facility]. ?To find out your arrival time, please call [phone] between 1PM - 3PM on: 09/22/21 - Wednesday ? ?REMEMBER: ?Instructions that are not followed completely may result in serious medical risk, up to and including death; or upon the discretion of your surgeon and anesthesiologist your surgery may need to be rescheduled. ? ?Do not eat food after midnight the night before surgery.  ?No gum chewing, lozengers or hard candies. ? ?You may however, drink CLEAR liquids up to 2 hours before you are scheduled to arrive for your surgery. Do not drink anything within 2 hours of your scheduled arrival time. ?Type 1 and Type 2 diabetics should only drink water. ? ?TAKE THESE MEDICATIONS THE MORNING OF SURGERY WITH A SIP OF WATER: ? ?- propranolol (INDERAL) 20 MG tablet ?- gemfibrozil (LOPID) 600 MG tablet ? ? ?Stop taking MetFORMIN (GLUCOPHAGE) 500 MG tablet beginning 09/21/21, may resume taking the day after your procedure. ? ?Stop taking Canagliflozin (INVOKANA) 300 MG TABS tablet beginning 09/20/21 , may resume taking the day after your procedure. ? ?One week prior to surgery: ?Stop Anti-inflammatories (NSAIDS) such as Advil, Aleve, Ibuprofen, Motrin, Naproxen, Naprosyn and Aspirin based products such as Excedrin, Goodys Powder, BC Powder. ? ?Stop ANY OVER THE COUNTER supplements until after surgery. ? ?You may take Tylenol if needed for pain up until the day of surgery. ? ?No Alcohol for 24 hours before or after surgery. ? ?No Smoking including e-cigarettes for 24 hours prior to surgery.  ?No chewable tobacco products for at least 6 hours prior to surgery.  ?No nicotine patches on the day of surgery. ? ?Do not use any "recreational" drugs for at least a week prior to your surgery.  ?Please be advised that the combination of cocaine and anesthesia may have negative outcomes, up to and including  death. ?If you test positive for cocaine, your surgery will be cancelled. ? ?On the morning of surgery brush your teeth with toothpaste and water, you may rinse your mouth with mouthwash if you wish. ?Do not swallow any toothpaste or mouthwash. ? ?Do not wear jewelry, make-up, hairpins, clips or nail polish. ? ?Do not wear lotions, powders, or perfumes.  ? ?Do not shave body from the neck down 48 hours prior to surgery just in case you cut yourself which could leave a site for infection.  ?Also, freshly shaved skin may become irritated if using the CHG soap. ? ?Contact lenses, hearing aids and dentures may not be worn into surgery. ? ?Do not bring valuables to the hospital. [facility] is not responsible for any missing/lost belongings or valuables.  ? ?Notify your doctor if there is any change in your medical condition (cold, fever, infection). ? ?Wear comfortable clothing (specific to your surgery type) to the hospital. ? ?After surgery, you can help prevent lung complications by doing breathing exercises.  ?Take deep breaths and cough every 1-2 hours. Your doctor may order a device called an Incentive Spirometer to help you take deep breaths. ?When coughing or sneezing, hold a pillow firmly against your incision with both hands. This is called ?splinting.? Doing this helps protect your incision. It also decreases belly discomfort. ? ?If you are being admitted to the hospital overnight, leave your suitcase in the car. ?After surgery it may be brought to your room. ? ?If you are being discharged the day of surgery, you will not  be allowed to drive home. ?You will need a responsible adult (18 years or older) to drive you home and stay with you that night.  ? ?If you are taking public transportation, you will need to have a responsible adult (18 years or older) with you. ?Please confirm with your physician that it is acceptable to use public transportation.  ? ?Please call the [facility] at [phone] if you have any questions about these instructions. ? ?Surgery Visitation Policy: ? ?Patients undergoing a surgery or procedure may have two family members or support persons with them as long as the person is not COVID-19 positive or experiencing its symptoms.  ? ?Inpatient Visitation:   ? ?Visiting hours are 7 a.m. to 8 p.m. ?Up to four visitors are allowed at one time in a patient room, including children. The visitors may rotate out with other people during the day. One designated support person (adult) may remain overnight.  ?

## 2021-09-22 MED ORDER — POVIDONE-IODINE 10 % EX SWAB: 2.0000 "application " | Freq: Once | CUTANEOUS | Status: DC

## 2021-09-22 MED ORDER — LACTATED RINGERS IV SOLN: INTRAVENOUS | Status: DC

## 2021-09-22 MED ORDER — ACETAMINOPHEN 500 MG PO TABS
1000.0000 mg | ORAL_TABLET | ORAL | Status: AC
  Administered 2021-09-23: 1000 mg via ORAL

## 2021-09-22 MED ORDER — SODIUM CHLORIDE 0.9 % IV SOLN
INTRAVENOUS | Status: DC
Start: 2021-09-22 — End: 2021-09-23

## 2021-09-22 MED ORDER — GABAPENTIN 300 MG PO CAPS
300.0000 mg | ORAL_CAPSULE | ORAL | Status: AC
  Administered 2021-09-23: 300 mg via ORAL

## 2021-09-22 MED ORDER — CHLORHEXIDINE GLUCONATE 0.12 % MT SOLN
15.0000 mL | Freq: Once | OROMUCOSAL | Status: AC
  Administered 2021-09-23: 15 mL via OROMUCOSAL

## 2021-09-22 MED ORDER — ORAL CARE MOUTH RINSE: 15.0000 mL | Freq: Once | OROMUCOSAL | Status: AC

## 2021-09-22 NOTE — Anesthesia Preprocedure Evaluation (Addendum)
Anesthesia Evaluation  ?Patient identified by MRN, date of birth, ID band ?Patient awake ? ? ? ?Reviewed: ?Allergy & Precautions, NPO status , Patient's Chart, lab work & pertinent test results ? ?History of Anesthesia Complications ?Negative for: history of anesthetic complications ? ?Airway ?Mallampati: II ? ? ? ? ? ? Dental ? ?(+) Dental Advidsory Given ?  ?Pulmonary ?neg sleep apnea, neg COPD, Current SmokerPatient did not abstain from smoking.,  ?  ?Pulmonary exam normal ? ? ? ? ? ? ? Cardiovascular ?Exercise Tolerance: Good ?hypertension, (-) Past MI and (-) CHF Normal cardiovascular exam(-) dysrhythmias (-) Valvular Problems/Murmurs ? ? ?  ?Neuro/Psych ? Headaches, neg Seizures   ? GI/Hepatic ?Neg liver ROS, neg GERD  ,  ?Endo/Other  ?diabetes ? Renal/GU ?negative Renal ROS  ? ?  ?Musculoskeletal ? ? Abdominal ?(+) + obese,   ?Peds ? Hematology ?  ?Anesthesia Other Findings ?Past Medical History: ?2014: Breast mass, right ?No date: Diabetes mellitus without complication ([place]) ?No date: Eczema ?No date: PCOS (polycystic ovarian syndrome) ? ? Reproductive/Obstetrics ? ?  ? ? ? ? ? ? ? ? ? ? ? ? ? ?  ?  ? ? ? ? ? ? ? ?Anesthesia Physical ? ?Anesthesia Plan ? ?ASA: II ? ?Anesthesia Plan: General  ? ?Post-op Pain Management: Minimal or no pain anticipated, Gabapentin PO (pre-op)* and Tylenol PO (pre-op)*  ? ?Induction: Intravenous ? ?PONV Risk Score and Plan: 2 and Ondansetron, Dexamethasone and Midazolam ? ?Airway Management Planned: LMA ? ?Additional Equipment:  ? ?Intra-op Plan:  ? ?Post-operative Plan: Extubation in OR ? ?Informed Consent: I have reviewed the patients History and Physical, chart, labs and discussed the procedure including the risks, benefits and alternatives for the proposed anesthesia with the patient or authorized representative who has indicated his/her understanding and acceptance.  ? ? ? ?Dental advisory given ? ?Plan Discussed with: Anesthesiologist and  CRNA ? ?Anesthesia Plan Comments:   ? ? ? ? ?Anesthesia Quick Evaluation ? ?

## 2021-09-23 ENCOUNTER — Other Ambulatory Visit: Payer: Self-pay

## 2021-09-23 ENCOUNTER — Ambulatory Visit: Payer: Medicaid Other | Admitting: Anesthesiology

## 2021-09-23 ENCOUNTER — Encounter: Payer: Self-pay | Admitting: Obstetrics and Gynecology

## 2021-09-23 ENCOUNTER — Encounter
Admission: RE | Disposition: A | Discharge status: Home / Self Care | Payer: Self-pay | Source: Ambulatory Visit | Attending: Obstetrics and Gynecology

## 2021-09-23 ENCOUNTER — Ambulatory Visit
Admission: RE | Admit: 2021-09-23 | Discharge: 2021-09-23 | Disposition: A | Discharge status: Home / Self Care | Payer: Medicaid Other | Source: Ambulatory Visit | Attending: Obstetrics and Gynecology | Admitting: Obstetrics and Gynecology

## 2021-09-23 DIAGNOSIS — E282 Polycystic ovarian syndrome: Secondary | ICD-10-CM | POA: Diagnosis not present

## 2021-09-23 DIAGNOSIS — N72 Inflammatory disease of cervix uteri: Secondary | ICD-10-CM | POA: Insufficient documentation

## 2021-09-23 DIAGNOSIS — Z01818 Encounter for other preprocedural examination: Secondary | ICD-10-CM

## 2021-09-23 DIAGNOSIS — F17210 Nicotine dependence, cigarettes, uncomplicated: Secondary | ICD-10-CM | POA: Diagnosis not present

## 2021-09-23 DIAGNOSIS — E119 Type 2 diabetes mellitus without complications: Secondary | ICD-10-CM | POA: Diagnosis not present

## 2021-09-23 DIAGNOSIS — E669 Obesity, unspecified: Secondary | ICD-10-CM | POA: Diagnosis not present

## 2021-09-23 DIAGNOSIS — Z6832 Body mass index (BMI) 32.0-32.9, adult: Secondary | ICD-10-CM | POA: Insufficient documentation

## 2021-09-23 DIAGNOSIS — D069 Carcinoma in situ of cervix, unspecified: Secondary | ICD-10-CM | POA: Diagnosis not present

## 2021-09-23 HISTORY — PX: LEEP: SHX91

## 2021-09-23 LAB — TYPE AND SCREEN
ABO/RH(D): O NEG
Antibody Screen: NEGATIVE

## 2021-09-23 LAB — ABO/RH: ABO/RH(D): O NEG

## 2021-09-23 LAB — GLUCOSE, CAPILLARY
Glucose-Capillary: 130 mg/dL — ABNORMAL HIGH (ref 70–99)
Glucose-Capillary: 127 mg/dL — ABNORMAL HIGH (ref 70–99)

## 2021-09-23 LAB — POCT PREGNANCY, URINE: Preg Test, Ur: NEGATIVE

## 2021-09-23 SURGERY — LEEP (LOOP ELECTROSURGICAL EXCISION PROCEDURE)
Anesthesia: General | Site: Vagina

## 2021-09-23 MED ORDER — FAMOTIDINE 20 MG PO TABS
ORAL_TABLET | ORAL | Status: AC
Start: 2021-09-23 — End: 2021-09-23
  Administered 2021-09-23: 20 mg
  Filled 2021-09-23: qty 1

## 2021-09-23 MED ORDER — IODINE STRONG (LUGOLS) 5 % PO SOLN
ORAL | Status: AC
  Filled 2021-09-23: qty 1

## 2021-09-23 MED ORDER — MIDAZOLAM HCL 2 MG/2ML IJ SOLN
INTRAMUSCULAR | Status: DC | PRN
  Administered 2021-09-23: 2 mg via INTRAVENOUS

## 2021-09-23 MED ORDER — LIDOCAINE-EPINEPHRINE 1 %-1:100000 IJ SOLN
INTRAMUSCULAR | Status: DC | PRN
  Administered 2021-09-23: 10 mL

## 2021-09-23 MED ORDER — ONDANSETRON HCL 4 MG/2ML IJ SOLN
INTRAMUSCULAR | Status: DC | PRN
Start: 2021-09-23 — End: 2021-09-23
  Administered 2021-09-23: 4 mg via INTRAVENOUS

## 2021-09-23 MED ORDER — LIDOCAINE-EPINEPHRINE 1 %-1:100000 IJ SOLN
INTRAMUSCULAR | Status: AC
  Filled 2021-09-23: qty 1

## 2021-09-23 MED ORDER — GELATIN ABSORBABLE 12-7 MM EX MISC
CUTANEOUS | Status: AC
  Filled 2021-09-23: qty 1

## 2021-09-23 MED ORDER — ACETAMINOPHEN 500 MG PO TABS
ORAL_TABLET | ORAL | Status: DC
Start: 2021-09-23 — End: 2021-09-23
  Filled 2021-09-23: qty 2

## 2021-09-23 MED ORDER — FERRIC SUBSULFATE 259 MG/GM EX SOLN
CUTANEOUS | Status: AC
Start: 2021-09-23 — End: ?
  Filled 2021-09-23: qty 8

## 2021-09-23 MED ORDER — PHENYLEPHRINE HCL (PRESSORS) 10 MG/ML IV SOLN
INTRAVENOUS | Status: AC
Start: 2021-09-23 — End: ?
  Filled 2021-09-23: qty 1

## 2021-09-23 MED ORDER — 0.9 % SODIUM CHLORIDE (POUR BTL) OPTIME
TOPICAL | Status: DC | PRN
  Administered 2021-09-23: 500 mL

## 2021-09-23 MED ORDER — IODINE STRONG (LUGOLS) 5 % PO SOLN
ORAL | Status: DC | PRN
  Administered 2021-09-23: 14 mL

## 2021-09-23 MED ORDER — MIDAZOLAM HCL 2 MG/2ML IJ SOLN
INTRAMUSCULAR | Status: AC
  Filled 2021-09-23: qty 2

## 2021-09-23 MED ORDER — FERRIC SUBSULFATE 259 MG/GM EX SOLN
CUTANEOUS | Status: DC | PRN
Start: 2021-09-23 — End: 2021-09-23
  Administered 2021-09-23: 1

## 2021-09-23 MED ORDER — CHLORHEXIDINE GLUCONATE 0.12 % MT SOLN
OROMUCOSAL | Status: AC
  Filled 2021-09-23: qty 15

## 2021-09-23 MED ORDER — GABAPENTIN 300 MG PO CAPS
ORAL_CAPSULE | ORAL | Status: AC
  Filled 2021-09-23: qty 1

## 2021-09-23 MED ORDER — OXYCODONE HCL 5 MG PO TABS
ORAL_TABLET | ORAL | Status: AC
  Filled 2021-09-23: qty 1

## 2021-09-23 MED ORDER — OXYCODONE HCL 5 MG/5ML PO SOLN: 5.0000 mg | Freq: Once | ORAL | Status: AC | PRN

## 2021-09-23 MED ORDER — GLYCOPYRROLATE 0.2 MG/ML IJ SOLN
INTRAMUSCULAR | Status: DC | PRN
  Administered 2021-09-23: .2 mg via INTRAVENOUS

## 2021-09-23 MED ORDER — PROMETHAZINE HCL 25 MG/ML IJ SOLN: 6.2500 mg | INTRAMUSCULAR | Status: DC | PRN

## 2021-09-23 MED ORDER — ACETAMINOPHEN 10 MG/ML IV SOLN: 1000.0000 mg | Freq: Once | INTRAVENOUS | Status: DC | PRN

## 2021-09-23 MED ORDER — FENTANYL CITRATE (PF) 100 MCG/2ML IJ SOLN
INTRAMUSCULAR | Status: DC | PRN
  Administered 2021-09-23 (×2): 50 ug via INTRAVENOUS

## 2021-09-23 MED ORDER — LIDOCAINE HCL (CARDIAC) PF 100 MG/5ML IV SOSY
PREFILLED_SYRINGE | INTRAVENOUS | Status: DC | PRN
Start: 2021-09-23 — End: 2021-09-23
  Administered 2021-09-23: 80 mg via INTRAVENOUS

## 2021-09-23 MED ORDER — PROPOFOL 10 MG/ML IV BOLUS
INTRAVENOUS | Status: AC
  Filled 2021-09-23: qty 20

## 2021-09-23 MED ORDER — LACTATED RINGERS IV SOLN: INTRAVENOUS | Status: DC | PRN

## 2021-09-23 MED ORDER — FENTANYL CITRATE (PF) 100 MCG/2ML IJ SOLN: 25.0000 ug | INTRAMUSCULAR | Status: DC | PRN

## 2021-09-23 MED ORDER — PROPOFOL 10 MG/ML IV BOLUS
INTRAVENOUS | Status: DC | PRN
  Administered 2021-09-23: 160 mg via INTRAVENOUS

## 2021-09-23 MED ORDER — OXYCODONE HCL 5 MG PO TABS
5.0000 mg | ORAL_TABLET | Freq: Once | ORAL | Status: AC | PRN
  Administered 2021-09-23: 5 mg via ORAL

## 2021-09-23 MED ORDER — FENTANYL CITRATE (PF) 100 MCG/2ML IJ SOLN
INTRAMUSCULAR | Status: AC
  Filled 2021-09-23: qty 2

## 2021-09-23 MED ORDER — DEXAMETHASONE SODIUM PHOSPHATE 10 MG/ML IJ SOLN
INTRAMUSCULAR | Status: DC | PRN
  Administered 2021-09-23: 5 mg via INTRAVENOUS

## 2021-09-23 SURGICAL SUPPLY — 39 items
APL SWBSTK 6 STRL LF DISP (MISCELLANEOUS) ×1
APPLICATOR COTTON TIP 6 STRL (MISCELLANEOUS) ×1 IMPLANT
APPLICATOR COTTON TIP 6IN STRL (MISCELLANEOUS) ×2
BACTOSHIELD CHG 4% 4OZ (MISCELLANEOUS) ×1
CATH ROBINSON RED A/P 16FR (CATHETERS) ×2 IMPLANT
CNTNR SPEC 2.5X3XGRAD LEK (MISCELLANEOUS) ×1
CONT SPEC 4OZ STER OR WHT (MISCELLANEOUS) ×1
CONT SPEC 4OZ STRL OR WHT (MISCELLANEOUS) ×1
CONTAINER SPEC 2.5X3XGRAD LEK (MISCELLANEOUS) ×1 IMPLANT
DEPRESSOR TONGUE BLADE STERILE (MISCELLANEOUS) ×1 IMPLANT
DRAPE PERI LITHO V/GYN (MISCELLANEOUS) ×2 IMPLANT
ELECT LEEP 2.0X0.8 R2008 (MISCELLANEOUS) ×2
ELECT LEEP BALL 5MM 12CM (MISCELLANEOUS) ×2
ELECT LOOP 1.0X1.0CM R1010 (MISCELLANEOUS) ×2
ELECT REM PT RETURN 9FT ADLT (ELECTROSURGICAL) ×2
ELECTRODE LEEP 2.0X0.8 R2008 (MISCELLANEOUS) ×1 IMPLANT
ELECTRODE LEEP BALL 5MM 12CM (MISCELLANEOUS) ×1 IMPLANT
ELECTRODE LOOP 1.0X1.0CM R1010 (MISCELLANEOUS) ×1 IMPLANT
ELECTRODE REM PT RTRN 9FT ADLT (ELECTROSURGICAL) ×1 IMPLANT
GLOVE SURG SYN 8.0 (GLOVE) ×2 IMPLANT
GLOVE SURG SYN 8.0 PF PI (GLOVE) ×1 IMPLANT
GOWN STRL REUS W/ TWL LRG LVL3 (GOWN DISPOSABLE) ×1 IMPLANT
GOWN STRL REUS W/ TWL XL LVL3 (GOWN DISPOSABLE) ×1 IMPLANT
GOWN STRL REUS W/TWL LRG LVL3 (GOWN DISPOSABLE) ×2
GOWN STRL REUS W/TWL XL LVL3 (GOWN DISPOSABLE) ×2
KIT TURNOVER CYSTO (KITS) ×2 IMPLANT
MANIFOLD NEPTUNE II (INSTRUMENTS) ×2 IMPLANT
NDL HYPO 30X.5 LL (NEEDLE) IMPLANT
NDL SPNL 22GX3.5 QUINCKE BK (NEEDLE) ×1 IMPLANT
NEEDLE HYPO 30X.5 LL (NEEDLE) ×4 IMPLANT
NEEDLE SPNL 22GX3.5 QUINCKE BK (NEEDLE) ×2 IMPLANT
PACK BASIN MINOR ARMC (MISCELLANEOUS) ×2 IMPLANT
PAD OB MATERNITY 4.3X12.25 (PERSONAL CARE ITEMS) ×2 IMPLANT
PAD PREP 24X41 OB/GYN DISP (PERSONAL CARE ITEMS) ×2 IMPLANT
SCRUB CHG 4% DYNA-HEX 4OZ (MISCELLANEOUS) ×1 IMPLANT
STRAW SMOKE EVAC LEEP 6150 NON (MISCELLANEOUS) ×2 IMPLANT
SYR CONTROL 10ML LL (SYRINGE) ×2 IMPLANT
TOWEL OR 17X26 4PK STRL BLUE (TOWEL DISPOSABLE) ×2 IMPLANT
sterile rayon tipped applicator ×2 IMPLANT

## 2021-09-23 NOTE — Transfer of Care (Signed)
Immediate Anesthesia Transfer of Care Note ? ?Patient: Kim Crawford ? ?Procedure(s) Performed: LOOP ELECTROSURGICAL EXCISION PROCEDURE (LEEP) (Vagina ) ? ?Patient Location: PACU ? ?Anesthesia Type:General ? ?Level of Consciousness: awake, alert  and oriented ? ?Airway & Oxygen Therapy: Patient Spontanous Breathing and Patient connected to face mask oxygen ? ?Post-op Assessment: Report given to RN and Post -op Vital signs reviewed and stable ? ?Post vital signs: Reviewed and stable ? ?Last Vitals:  ?Vitals Value Taken Time  ?BP 158/103 09/23/21 0821  ?Temp    ?Pulse 81 09/23/21 0823  ?Resp 15 09/23/21 0823  ?SpO2 100 % 09/23/21 0823  ?Vitals shown include unvalidated device data. ? ?Last Pain:  ?Vitals:  ? 09/23/21 0645  ?TempSrc: Temporal  ?PainSc: 6   ?   ? ?Patients Stated Pain Goal: 0 (09/23/21 0645) ? ?Complications: No notable events documented. ?

## 2021-09-23 NOTE — Discharge Instructions (Signed)
AMBULATORY SURGERY  ?DISCHARGE INSTRUCTIONS ? ? ?The drugs that you were given will stay in your system until tomorrow so for the next 24 hours you should not: ? ?Drive an automobile ?Make any legal decisions ?Drink any alcoholic beverage ? ? ?You may resume regular meals tomorrow.  Today it is better to start with liquids and gradually work up to solid foods. ? ?You may eat anything you prefer, but it is better to start with liquids, then soup and crackers, and gradually work up to solid foods. ? ? ?Please notify your doctor immediately if you have any unusual bleeding, trouble breathing, redness and pain at the surgery site, drainage, fever, or pain not relieved by medication. ? ? ? ?Additional Instructions: ? ? ? ?Please contact your physician with any problems or Same Day Surgery at [phone], Monday through Friday 6 am to 4 pm, or [facility] number at [phone].  ?

## 2021-09-23 NOTE — Progress Notes (Signed)
Pt scheduled for cervical  LEEP . For cervical dysplasia . NPO . Hcg neg . All questions answered . Proceed  ?

## 2021-09-23 NOTE — Brief Op Note (Signed)
09/23/2021 ? ?8:13 AM ? ?PATIENT:  Kim Crawford  31 y.o. female ? 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 ?POST-OPERATIVE DIAGNOSIS:  CIN 2-3 ? ?PROCEDURE:  Procedure(s): ?LOOP ELECTROSURGICAL EXCISION PROCEDURE (LEEP) (N/A) ?Cervical LEEP  ? ?SURGEON:  Surgeon(s) and Role: ?   * Brookelynn Hamor, Gwen Her, MD - Primary ? ?PHYSICIAN ASSISTANT:  ? ?ASSISTANTS: none  ? ?ANESTHESIA:    lma ? ?EBL:  ebl - minimal  IOF 600 cc, ou 20 cc ? ?BLOOD ADMINISTERED:none ? ?DRAINS: none  ? ?LOCAL MEDICATIONS USED:  LIDOCAINE  ? ?SPECIMEN:  Source of Specimen:  anterior leep pinned at 12, mid leep , posterior leep pinned at 6  , Hat , ecc ? ?DISPOSITION OF SPECIMEN:  PATHOLOGY ? ?COUNTS:  YES ? ?TOURNIQUET:  * No tourniquets in log * ? ?DICTATION: .Other Dictation: Dictation Number verbal ? ?PLAN OF CARE: Discharge to home after PACU ? ?PATIENT DISPOSITION:  PACU - hemodynamically stable. ?  ?Delay start of Pharmacological VTE agent (>24hrs) due to surgical blood loss or risk of bleeding: not applicable ? ?

## 2021-09-23 NOTE — Anesthesia Procedure Notes (Signed)
Procedure Name: LMA Insertion ?Date/Time: 09/23/2021 7:43 AM ?Performed by: Willette Alma, CRNA ?Pre-anesthesia Checklist: Patient identified, Patient being monitored, Timeout performed, Emergency Drugs available and Suction available ?Patient Re-evaluated:Patient Re-evaluated prior to induction ?Oxygen Delivery Method: Circle system utilized ?Preoxygenation: Pre-oxygenation with 100% oxygen ?Induction Type: IV induction ?Ventilation: Mask ventilation without difficulty ?LMA: LMA inserted ?LMA Size: 5.0 ?Tube type: Oral ?Number of attempts: 1 ?Placement Confirmation: positive ETCO2 and breath sounds checked- equal and bilateral ?Tube secured with: Tape ?Dental Injury: Teeth and Oropharynx as per pre-operative assessment  ? ? ? ? ?

## 2021-09-23 NOTE — Progress Notes (Signed)
?   09/23/21 0700  ?Clinical Encounter Type  ?Visited With Patient and family together  ?Visit Type Initial;Pre-op  ?Spiritual Encounters  ?Spiritual Needs Prayer  ? ?Chaplain provided pre-op support through meaningful conversation and prayer. ?

## 2021-09-23 NOTE — Anesthesia Postprocedure Evaluation (Signed)
Anesthesia Post Note ? ?Patient: Kim Crawford ? ?Procedure(s) Performed: LOOP ELECTROSURGICAL EXCISION PROCEDURE (LEEP) (Vagina ) ? ?Patient location during evaluation: PACU ?Anesthesia Type: General ?Level of consciousness: awake and alert ?Pain management: pain level controlled ?Vital Signs Assessment: post-procedure vital signs reviewed and stable ?Respiratory status: spontaneous breathing, nonlabored ventilation and respiratory function stable ?Cardiovascular status: blood pressure returned to baseline and stable ?Postop Assessment: no apparent nausea or vomiting ?Anesthetic complications: no ? ? ?No notable events documented. ? ? ?Last Vitals:  ?Vitals:  ? 09/23/21 0845 09/23/21 0856  ?BP: (!) 99/59 106/70  ?Pulse: 77 81  ?Resp: 15 17  ?Temp: (!) 36.2 ?C 36.9 ?C  ?SpO2: 90% 95%  ?  ?Last Pain:  ?Vitals:  ? 09/23/21 0856  ?TempSrc: Axillary  ?PainSc: 5   ? ? ?  ?  ?  ?  ?  ?  ? ?[place] Ouch ? ? ? ? ?

## 2021-09-24 ENCOUNTER — Encounter: Payer: Self-pay | Admitting: Obstetrics and Gynecology

## 2021-09-24 LAB — SURGICAL PATHOLOGY

## 2021-09-24 NOTE — Op Note (Signed)
NAME: Kim Crawford, Kim A. ?MEDICAL RECORD NO: [id number] ?ACCOUNT NO: [id number] ?DATE OF BIRTH: 11/09/1990 ?FACILITY: [facility] ?LOCATION: [facility] ?PHYSICIAN: Boykin Nearing, MD ? ?Operative Report  ? ?DATE OF PROCEDURE: 09/23/2021 ? ?PREOPERATIVE DIAGNOSIS:  Severe cervical dysplasia. ? ?POSTOPERATIVE DIAGNOSIS:  Severe cervical dysplasia. ? ?PROCEDURE:  Cervical LEEP. ? ?SURGEON:  Boykin Nearing, MD ? ?ANESTHESIA:  LMA. ? 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  The patient could not tolerate procedure in the office; therefore has opted to have the cervical LEEP done in the operating room. ? ?DESCRIPTION OF PROCEDURE:  After adequate LMA anesthesia, the patient was placed in dorsal supine position with legs in the candy cane stirrups.  Lower abdomen, perineum and vagina were prepped and draped in normal sterile fashion.  Timeout was  ?performed.  Straight catheterization of the bladder yielded 20 mL dark urine.  Speculum was placed in the vagina and the cervix was circumferentially injected with 1% lidocaine with 1:100,000 epinephrine.  Lugol solution was applied to the cervix and  ?cervical LEEP was performed with an anterior LEEP with pinning at 12 o'clock.  A mid LEEP, a posterior LEEP obtained at 6 o'clock, a cervical hat and an endocervical curettage.  .  Cautery was used to control oozing  ?and astringent was placed in the cervical bed.  Good hemostasis was noted.  There were no complications.  The patient tolerated the procedure well and was taken to recovery room in good condition.  ? ? ? ?[place] ?D: 09/23/2021 8:34:26 am T: 09/24/2021 1:15:00 am  ?JOB: [id number]/ [id number]  ?

## 2022-02-09 ENCOUNTER — Ambulatory Visit (INDEPENDENT_AMBULATORY_CARE_PROVIDER_SITE_OTHER): Payer: Medicaid Other | Admitting: Internal Medicine

## 2022-02-09 ENCOUNTER — Encounter: Payer: Self-pay | Admitting: Internal Medicine

## 2022-02-09 VITALS — BP 124/76 | HR 88 | Ht 65.0 in | Wt 185.5 lb

## 2022-02-09 DIAGNOSIS — E781 Pure hyperglyceridemia: Secondary | ICD-10-CM

## 2022-02-09 DIAGNOSIS — E1165 Type 2 diabetes mellitus with hyperglycemia: Secondary | ICD-10-CM

## 2022-02-09 NOTE — Patient Instructions (Addendum)
Continue Metformin 500 mg twice daily  Continue Ozempic 1 mg weekly    HOW TO TREAT LOW BLOOD SUGARS (Blood sugar LESS THAN 70 MG/DL) Please follow the RULE OF 15 for the treatment of hypoglycemia treatment (when your (blood sugars are less than 70 mg/dL)   STEP 1: Take 15 grams of carbohydrates when your blood sugar is low, which includes:  3-4 GLUCOSE TABS  OR 3-4 OZ OF JUICE OR REGULAR SODA OR ONE TUBE OF GLUCOSE GEL    STEP 2: RECHECK blood sugar in 15 MINUTES STEP 3: If your blood sugar is still low at the 15 minute recheck --> then, go back to STEP 1 and treat AGAIN with another 15 grams of carbohydrates.

## 2022-02-09 NOTE — Progress Notes (Unsigned)
Name: Kim Crawford  MRN/ DOB: [id number], 20-Oct-1990   Age/ Sex: 31 y.o., female    PCP: Idelle Crouch, MD   Reason for Endocrinology Evaluation: Type 2 Diabetes Mellitus     Date of Initial Endocrinology Visit: 02/09/2022     PATIENT IDENTIFIER: Kim Crawford is a 31 y.o. female with a past medical history of T2DM, dyslipidemia, HTN. The patient presented for initial endocrinology clinic visit on 02/09/2022 for consultative assistance with her diabetes management.    HPI: Kim Crawford  is accompanied by her mother    Diagnosed with DM 2020 Prior Medications tried/Intolerance: She was on Invokana and has 2 yeast infection  Currently checking blood sugars multiple   Hypoglycemia episodes : no               Hemoglobin A1c has ranged from 6.8 % in 2021, peaking at 7.6% in 2023 Patient has required hospitalization within the last 1 year from hyper or hypoglycemia: no  In terms of diet, the patient eats 2 meals a day, avoids sugar- sweetened beverages   Has right hand tinging but no feet tingling  She is schedule for hystrectomy in 03/2021    HOME DIABETES REGIMEN: Metformin 500 mg BID Ozempic 1 mg weekly ( Tuesday)     Statin: no ACE-I/ARB: no    CONTINUOUS GLUCOSE MONITORING RECORD INTERPRETATION    Dates of Recording: 7/31-8/13/2023  Sensor description:freestyle libre  Results statistics:   CGM use % of time 33  Average and SD 111/25  Time in range    96    %  % Time Above 180 4  % Time above 250 0  % Time Below target 0     Glycemic patterns summary: Bg's optimal through the day and night  Hyperglycemic episodes   postprandial   Hypoglycemic episodes occurred n/a  Overnight periods: optimal     DIABETIC COMPLICATIONS: Microvascular complications:   Denies: CKD, retinopaty, neuropathy Last eye exam: Completed 2023  Macrovascular complications:   Denies: CAD, PVD, CVA   PAST HISTORY: Past Medical History:  Past Medical  History:  Diagnosis Date   Breast mass, right 2014   Diabetes mellitus without complication ([place])    Eczema    Headache    migaines   Hypertension    PCOS (polycystic ovarian syndrome)    Past Surgical History:  Past Surgical History:  Procedure Laterality Date   BREAST SURGERY Right 2014   breast mass   CESAREAN SECTION  2011   COLONOSCOPY WITH PROPOFOL N/A 05/29/2019   Procedure: COLONOSCOPY WITH PROPOFOL;  Surgeon: Lin Landsman, MD;  Location: [facility] END[facility];  Service: Gastroenterology;  Laterality: N/A;   LEEP N/A 09/23/2021   Procedure: LOOP ELECTROSURGICAL EXCISION PROCEDURE (LEEP);  Surgeon: Schermerhorn, Gwen Her, MD;  Location: [facility] ORS;  Service: Gynecology;  Laterality: N/A;   PILONIDAL CYST EXCISION N/A 02/27/2018   Procedure: CYST EXCISION PILONIDAL SIMPLE;  Surgeon: Jules Husbands, MD;  Location: [facility] ORS;  Service: General;  Laterality: N/A;    Social History:  reports that she has been smoking cigarettes. She has a 6.00 pack-year smoking history. She has never used smokeless tobacco. She reports current alcohol use. She reports that she does not use drugs. Family History:  Family History  Adopted: Yes  Problem Relation Age of Onset   Hypertension Mother    Diabetes Mother    Parkinson's disease Father      HOME MEDICATIONS: Allergies as of 02/09/2022  Reactions   Toradol [ketorolac Tromethamine] Other (See Comments)   "Suicide headaches"   Tramadol Other (See Comments)   Severe headaches   Hydrocodone Other (See Comments)   Severe migraine headaches (Can take in small doses)   Penicillins Rash, Other (See Comments)   Has patient had a PCN reaction causing immediate rash, facial/tongue/throat swelling, SOB or lightheadedness with hypotension: Unknown Has patient had a PCN reaction causing severe rash involving mucus membranes or skin necrosis: No Has patient had a PCN reaction that required hospitalization: No Has patient had a PCN reaction  occurring within the last 10 years: No If all of the above answers are "NO", then may proceed with Cephalosporin use.        Medication List        Accurate as of February 09, 2022  9:39 AM. If you have any questions, ask your nurse or doctor.          STOP taking these medications    canagliflozin 300 MG Tabs tablet Commonly known as: INVOKANA Stopped by: Dorita Sciara, MD       TAKE these medications    blood glucose meter kit and supplies Kit Dispense based on patient and insurance preference. Use up to four times daily as directed. (FOR ICD-9 250.00, 250.01).   cyclobenzaprine 10 MG tablet Commonly known as: FLEXERIL Take 10 mg by mouth 2 (two) times daily as needed for muscle spasms.   FreeStyle Libre 14 Day Sensor Misc by Does not apply route.   gemfibrozil 600 MG tablet Commonly known as: LOPID Take 600 mg by mouth in the morning and at bedtime.   ibuprofen 200 MG tablet Commonly known as: ADVIL Take 200 mg by mouth every 6 (six) hours as needed.   metFORMIN 500 MG tablet Commonly known as: GLUCOPHAGE Take 500 mg by mouth in the morning and at bedtime.   multivitamin-prenatal 27-0.8 MG Tabs tablet Take 1 tablet by mouth in the morning.   Ozempic (1 MG/DOSE) 4 MG/3ML Sopn Generic drug: Semaglutide (1 MG/DOSE) Inject 1 mg into the skin once a week.   propranolol 20 MG tablet Commonly known as: INDERAL Take 20 mg by mouth 2 (two) times daily.         ALLERGIES: Allergies  Allergen Reactions   Toradol [Ketorolac Tromethamine] Other (See Comments)    "Suicide headaches"   Tramadol Other (See Comments)    Severe headaches   Hydrocodone Other (See Comments)    Severe migraine headaches (Can take in small doses)   Penicillins Rash and Other (See Comments)    Has patient had a PCN reaction causing immediate rash, facial/tongue/throat swelling, SOB or lightheadedness with hypotension: Unknown Has patient had a PCN reaction causing severe  rash involving mucus membranes or skin necrosis: No Has patient had a PCN reaction that required hospitalization: No Has patient had a PCN reaction occurring within the last 10 years: No If all of the above answers are "NO", then may proceed with Cephalosporin use.      REVIEW OF SYSTEMS: A comprehensive ROS was conducted with the patient and is negative except as per HPI and below:  Review of Systems  Gastrointestinal:  Negative for diarrhea, nausea and vomiting.  Neurological:  Negative for tingling.      OBJECTIVE:   VITAL SIGNS: BP 124/76 (BP Location: Left Arm, Patient Position: Sitting, Cuff Size: Small)   Pulse 88   Ht _0  (1.651 m)   Wt 185 lb 8 oz (84.1  kg)   SpO2 98%   BMI 30.87 kg/m    PHYSICAL EXAM:  General: Pt appears well and is in NAD  Neck: General: Supple without adenopathy or carotid bruits. Thyroid: Thyroid size normal.  No goiter or nodules appreciated.  Lungs: Clear with good BS bilat with no rales, rhonchi, or wheezes  Heart: RRR with normal S1 and S2 and no gallops; no murmurs; no rub  Abdomen: Normoactive bowel sounds, soft, nontender, without masses or organomegaly palpable  Extremities:  Lower extremities - No pretibial edema. No lesions.  Neuro: MS is good with appropriate affect, pt is alert and Ox3    DM foot exam: 02/09/2022  The skin of the feet is intact without sores or ulcerations. The pedal pulses are 2+ on right and 2+ on left. The sensation is intact to a screening 5.07, 10 gram monofilament bilaterally    DATA REVIEWED:  Lab Results  Component Value Date   HGBA1C 7.5 (H) 05/27/2019   Lab Results  Component Value Date   CREATININE 0.61 05/30/2019   01/17/2022 BUN/Cr 13/0.6 GFR 115 Tg 130 LDL 84 HDL 28.7 A1c 7.3%   ASSESSMENT / PLAN / RECOMMENDATIONS:   1) Type 2 Diabetes Mellitus, Sub-optimally controlled, Without complications - Most recent A1c of 7.3 %. Goal A1c < 7.0 %.    Plan: GENERAL: I have discussed  with the patient the pathophysiology of diabetes. We went over the natural progression of the disease. We stressed the importance of lifestyle changes . I explained the complications associated with diabetes including retinopathy, nephropathy, neuropathy as well as increased risk of cardiovascular disease. We went over the benefit seen with glycemic control.   I explained to the patient that diabetic patients are at higher than normal risk for amputations.  She developed 2 yeast  infections while on Invokana Her Ozempic dose was recently increased to 1 mg by her PCP, no intolerance issues at this time Patient is keen on tapering off metformin, we discussed the side effects as well as the cardiovascular and glycemic benefits of metformin, she is under the impression that metformin causes kidney damage, I did explain to the patient that diabetes would cause kidney damage but her metformin dose will need to be adjusted according to her GFR I did explain to the patient that I would not recommend reducing her medications unless her A1c is below 6.5% I emphasized the importance of avoiding sugar sweetened beverages and avoiding snacks if possible  MEDICATIONS: Continue metformin 500 mg twice daily Continue Ozempic 1 mg weekly   EDUCATION / INSTRUCTIONS: BG monitoring instructions: Patient is instructed to check her blood sugars 3 times a day, before meals . Call [facility] if: BG persistently < 70  I reviewed the Rule of 15 for the treatment of hypoglycemia in detail with the patient. Literature supplied.   2) Diabetic complications:  Eye: Does not have known diabetic retinopathy.  Neuro/ Feet: Does not have known diabetic peripheral neuropathy. Renal: Patient does not have known baseline CKD. She is not on an ACEI/ARB at present.  3) Hypertriglyceridemia    - She had an elevated Tg as high as 839 mg/dL in 08/2019 , that's when she was started on Gemfibrozil  - Repeat Tg and  LDL are normal recently  -She is currently on gemfibrozil -I did discuss with the patient by age of 36 and based on ADA recommendations she will need to be on statin therapy due to cardiovascular benefits  Signed electronically by: Mack Guise, MD  [facility]  [facility] [address] [place], [place] [address] Phone: [phone] FAX: [phone]   CC: Idelle Crouch, MD [place] [facility] [place] [address] Phone: [phone]  Fax: [phone]    Return to Endocrinology clinic as below: Future Appointments  Date Time Provider [place]  03/23/2022  1:00 PM [facility] None  06/14/2022  9:30 AM Sudeep Scheibel, Melanie Crazier, MD [facility] None

## 2022-02-21 ENCOUNTER — Telehealth: Payer: Self-pay | Admitting: Internal Medicine

## 2022-02-21 NOTE — Telephone Encounter (Signed)
Referring office called for progress notes from patients initial visit on 02/09/22. Please fax to [place]: April at [phone]

## 2022-02-21 NOTE — Telephone Encounter (Signed)
Progress notes faxed to number provided

## 2022-03-14 ENCOUNTER — Ambulatory Visit: Payer: Self-pay | Admitting: Surgery

## 2022-03-14 NOTE — H&P (Signed)
Subjective:    CC: Biliary colic [P32.95]   HPI:  Kim Crawford is a 31 y.o. female who was referred by Megan Salon for evaluation of above CC. Symptoms were first noted a few weeks ago. Pain is sharp and recurrent , started in RUQ and shoulder with initial episode, subsequent episodes more localized to right scapula, lasts for few minutes after eating..  Associated with nothing specific, exacerbated by eating any kind of of food, but not all the time.     Past Medical History:  has a past medical history of CIN I (cervical intraepithelial neoplasia I), CIN II (cervical intraepithelial neoplasia II), CIN III (cervical intraepithelial neoplasia III), and Diabetes mellitus without complication (CMS-HCC).   Past Surgical History:  has a past surgical history that includes Cesarean section and Cervical biopsy w/ loop electrode excision (09/2021).   Family History: family history is not on file. She was adopted.   Social History:  reports that she has been smoking cigarettes. She has never used smokeless tobacco. She reports current alcohol use. She reports that she does not use drugs.   Current Medications: has a current medication list which includes the following prescription(s): bupropion, cyclobenzaprine, freestyle libre 14 day sensor, gemfibrozil, metformin, semaglutide, and propranolol.   Allergies:       Allergies as of 03/14/2022 - Reviewed 03/14/2022  Allergen Reaction Noted   Penicillins Hives, Other (See Comments), and Rash 03/07/2013   Ketorolac tromethamine Other (See Comments) 05/17/2018   Tramadol Other (See Comments) and Headache 11/14/2016   (d)-limonene flavor Abdominal Pain 02/10/2021   Hydrocodone Headache and Other (See Comments) 10/16/2019      ROS:  A 15 point review of systems was performed and pertinent positives and negatives noted in HPI    Objective:    BP 120/73 (BP Location: Left upper arm, Patient Position: Sitting)   Pulse 83   Ht 165.1 cm (5' 5" )  Comment: per chart  Wt 83.6 kg (184 lb 3.2 oz)   BMI 30.65 kg/m      Constitutional :  No distress, cooperative, alert  Lymphatics/Throat:  Supple with no lymphadenopathy  Respiratory:  Clear to auscultation bilaterally  Cardiovascular:  Regular rate and rhythm  Gastrointestinal: Soft, non-tender, non-distended, no organomegaly.  Musculoskeletal: Steady gait and movement  Skin: Cool and moist  Psychiatric: Normal affect, non-agitated, not confused         LABS:  WNL at [facility] ED   RADS: This result has an attachment that is not available.  Limited Cardiac Ultrasound (CPT [id number])   Indication:  A focused ultrasound exam of the heart was performed to evaluate for  pericardial effusion, tamponade, severe hypovolemia, or gross  abnormalities of cardiac anatomy or function in this patient. The  ultrasound was performed with the following indications, as noted in the  H&P: Other indications as noted in the H&P   Identified structures:  The pericardial sac, myocardium, and 4 chambers were identified using the  following views: subxiphoid, parasternal long axis, parasternal short  axis, apical 4-chamber, and IVC (long axis)   Findings:  Exam of the above structures revealed the following findings:    Pericardial effusion: Absent    Pericardial tamponade: N/A   Global LV function: Normal   Right ventricular size: Normal    Signs of RV strain: N/A   IVC: Normal    Limitations: None.   Impression:   No sonographic evidence of significant cardiac dysfunction, No sonographic  evidence of significant pericardial effusion, and Normal RV  Interpreted by: Roosvelt Harps, MD   Limited Biliary Ultrasound (CPT: 63893-73)   Indication:  A focused ultrasound exam of the gallbladder and right upper quadrant was  performed to evaluate for gallstones, cholecystitis, and cholestasis in  this patient. The ultrasound was performed with the following indications,  as noted in the  H&P: other indications as noted in the H&P   Identified structures:  The gallbladder, gallbladder wall, common bile duct, liver, and porta  hepatis were examined.   Findings:  Exam of the above structures revealed the following findings:   Gallstones: Present  Sludge: Absent  Sonographic Murphy: Absent  Pericholecystic fluid: Absent  Intrahepatic cholestasis: Absent  GB wall thickness: Normal         Maximal GB wall thickness in transverse plane: 3.6 millimeters    Limitations: None.   Impression: Cholelithiasis without evidence of cholecystitis.    Interpreted by: Roosvelt Harps, MD   Quality Assurance   After review of the point-of-care ultrasound performed in this case I  assess the overall image quality as: Image quality: Minimal criteria met  for diagnosis, all structures imaged well and diagnosis easily supported   The accuracy of interpretation of images as presented reflects a true  negative for cardiac, positive cholelithiasis without cholecystitis for  RUQ   This study does  meet minimum criteria for credentialing and billing.   Dionisio Paschal, MD  Assessment:       Biliary colic [S28.76] hx consistent with gallstones noted on [place].    Plan:    1. Biliary colic [O11.57] Discussed the risk of surgery including post-op infxn, seroma, biloma, chronic pain, poor-delayed wound healing, retained gallstone, conversion to open procedure, post-op SBO or ileus, and need for additional procedures to address said risks.  The risks of general anesthetic including MI, CVA, sudden death or even reaction to anesthetic medications also discussed. Alternatives include continued observation.  Benefits include possible symptom relief, prevention of complications including acute cholecystitis, pancreatitis.   Typical post operative recovery of 3-5 days rest, continued pain in area and incision sites, possible loose stools up to 4-6 weeks, also discussed.   ED return precautions  given for sudden increase in RUQ pain, with possible accompanying fever, nausea, and/or vomiting.   The patient understands the risks, any and all questions were answered to the patient's satisfaction.   2. Patient has elected to proceed with surgical treatment. Procedure will be scheduled.  Written consent was obtained..robotic assisted laparoscopic, joint case with her hysterectomy with GYN.  We will proceed after GYN procedure.   labs/images/medications/previous chart entries reviewed personally and relevant changes/updates noted above.

## 2022-03-22 ENCOUNTER — Encounter (HOSPITAL_COMMUNITY): Payer: Self-pay

## 2022-03-22 ENCOUNTER — Inpatient Hospital Stay
Admission: RE | Admit: 2022-03-22 | Discharge: 2022-03-25 | DRG: 419 | Disposition: A | Discharge status: Home / Self Care | Payer: Medicaid Other | Source: Ambulatory Visit | Attending: Surgery | Admitting: Surgery

## 2022-03-22 ENCOUNTER — Ambulatory Visit: Payer: Self-pay | Admitting: Surgery

## 2022-03-22 DIAGNOSIS — E119 Type 2 diabetes mellitus without complications: Secondary | ICD-10-CM | POA: Diagnosis present

## 2022-03-22 DIAGNOSIS — K8010 Calculus of gallbladder with chronic cholecystitis without obstruction: Principal | ICD-10-CM | POA: Diagnosis present

## 2022-03-22 DIAGNOSIS — Z86001 Personal history of in-situ neoplasm of cervix uteri: Secondary | ICD-10-CM | POA: Diagnosis not present

## 2022-03-22 DIAGNOSIS — Z885 Allergy status to narcotic agent status: Secondary | ICD-10-CM | POA: Diagnosis not present

## 2022-03-22 DIAGNOSIS — Z880 Allergy status to penicillin: Secondary | ICD-10-CM

## 2022-03-22 DIAGNOSIS — K810 Acute cholecystitis: Principal | ICD-10-CM | POA: Diagnosis present

## 2022-03-22 DIAGNOSIS — Z888 Allergy status to other drugs, medicaments and biological substances status: Secondary | ICD-10-CM | POA: Diagnosis not present

## 2022-03-22 DIAGNOSIS — F17210 Nicotine dependence, cigarettes, uncomplicated: Secondary | ICD-10-CM | POA: Diagnosis present

## 2022-03-22 LAB — GLUCOSE, CAPILLARY: Glucose-Capillary: 130 mg/dL — ABNORMAL HIGH (ref 70–99)

## 2022-03-22 MED ORDER — PROPRANOLOL HCL 10 MG PO TABS
20.0000 mg | ORAL_TABLET | Freq: Two times a day (BID) | ORAL | Status: DC
Start: 2022-03-22 — End: 2022-03-25
  Administered 2022-03-22 – 2022-03-24 (×4): 20 mg via ORAL
  Filled 2022-03-22 (×4): qty 2

## 2022-03-22 MED ORDER — IBUPROFEN 400 MG PO TABS
200.0000 mg | ORAL_TABLET | Freq: Four times a day (QID) | ORAL | Status: DC | PRN
  Administered 2022-03-22: 200 mg via ORAL
  Filled 2022-03-22: qty 1

## 2022-03-22 MED ORDER — MORPHINE SULFATE (PF) 2 MG/ML IV SOLN
2.0000 mg | INTRAVENOUS | Status: DC | PRN
  Administered 2022-03-22 – 2022-03-25 (×7): 2 mg via INTRAVENOUS
  Filled 2022-03-22 (×7): qty 1

## 2022-03-22 MED ORDER — SODIUM CHLORIDE 0.9 % IV SOLN
2.0000 g | INTRAVENOUS | Status: DC
  Administered 2022-03-22 – 2022-03-23 (×2): 2 g via INTRAVENOUS
  Filled 2022-03-22 (×2): qty 20

## 2022-03-22 MED ORDER — ONDANSETRON 4 MG PO TBDP: 4.0000 mg | ORAL_TABLET | Freq: Four times a day (QID) | ORAL | Status: DC | PRN

## 2022-03-22 MED ORDER — INSULIN ASPART 100 UNIT/ML IJ SOLN
0.0000 [IU] | Freq: Three times a day (TID) | INTRAMUSCULAR | Status: DC
  Administered 2022-03-23 – 2022-03-24 (×2): 2 [IU] via SUBCUTANEOUS
  Administered 2022-03-24: 8 [IU] via SUBCUTANEOUS
  Administered 2022-03-25: 5 [IU] via SUBCUTANEOUS
  Filled 2022-03-22 (×4): qty 1

## 2022-03-22 MED ORDER — OXYCODONE-ACETAMINOPHEN 5-325 MG PO TABS
1.0000 | ORAL_TABLET | ORAL | Status: DC | PRN
  Administered 2022-03-23: 1 via ORAL
  Administered 2022-03-24 – 2022-03-25 (×3): 2 via ORAL
  Filled 2022-03-22 (×4): qty 2

## 2022-03-22 MED ORDER — DOCUSATE SODIUM 100 MG PO CAPS: 100.0000 mg | ORAL_CAPSULE | Freq: Two times a day (BID) | ORAL | Status: DC | PRN

## 2022-03-22 MED ORDER — ONDANSETRON HCL 4 MG/2ML IJ SOLN: 4.0000 mg | Freq: Four times a day (QID) | INTRAMUSCULAR | Status: DC | PRN

## 2022-03-22 NOTE — H&P (Signed)
Subjective:    CC: Acute cholecystitis [K81.0]   HPI:  Kim Crawford is a 31 y.o. female who returns for evaluation of above CC. Symptoms recurred1 day ago. Pain is achy and constant, midscapula to right scapula.  Associated with cough, nausea, exacerbated by nothing specific.  Alleviated by warm shower.     Past Medical History:  has a past medical history of CIN I (cervical intraepithelial neoplasia I), CIN II (cervical intraepithelial neoplasia II), CIN III (cervical intraepithelial neoplasia III), and Diabetes mellitus without complication (CMS-HCC).   Past Surgical History:  has a past surgical history that includes Cesarean section and Cervical biopsy w/ loop electrode excision (09/2021).   Family History: family history is not on file. She was adopted.   Social History:  reports that she has been smoking cigarettes. She has never used smokeless tobacco. She reports current alcohol use. She reports that she does not use drugs.   Current Medications: has a current medication list which includes the following prescription(s): bupropion, freestyle libre 14 day sensor, gemfibrozil, ibuprofen, metformin, semaglutide, ciprofloxacin hcl, cyclobenzaprine, metronidazole, oxycodone-acetaminophen, and propranolol.   Allergies:       Allergies as of 03/22/2022 - Reviewed 03/22/2022  Allergen Reaction Noted   Penicillins Hives, Other (See Comments), and Rash 03/07/2013   Ketorolac tromethamine Other (See Comments) 05/17/2018   Tramadol Other (See Comments) and Headache 11/14/2016   (d)-limonene flavor Abdominal Pain 02/10/2021   Hydrocodone Headache and Other (See Comments) 10/16/2019      ROS:  A 15 point review of systems was performed and pertinent positives and negatives noted in HPI    Objective:    BP 124/83   Pulse (!) 115   Temp (!) 38.2 C (100.8 F)   Ht 165.1 cm (5' 5" )   Wt 83.5 kg (184 lb)   BMI 30.62 kg/m      Constitutional :  No distress, cooperative, alert   Lymphatics/Throat:  Supple with no lymphadenopathy  Respiratory:  Clear to auscultation bilaterally. TTP along mid thoracic and right scapula  Cardiovascular:  Regular rate and rhythm  Gastrointestinal: Soft, minor TTP, RUQ non-distended, no organomegaly.  Musculoskeletal: Steady gait and movement  Skin: Cool and moist, no surgical scars  Psychiatric: Normal affect, non-agitated, not confused         LABS:  -      Lab Results  Component Value Date    WBC 12.5 (H) 03/22/2022    HGB 15.3 (H) 03/22/2022    HCT 45.1 03/22/2022    PLT 189 03/22/2022    -      Lab Results  Component Value Date    NA 137 03/22/2022    K 3.8 03/22/2022    CL 104 03/22/2022    CO2 27.7 03/22/2022    BUN 9 03/22/2022    CREATININE 0.8 03/22/2022    GLUCOSE 113 (H) 03/22/2022    -      Lab Results  Component Value Date    AST 30 03/22/2022    ALT 52 (H) 03/22/2022    ALKPHOS 84 03/22/2022    TBILI 0.7 03/22/2022    CONJBILI 0.14 03/22/2022    ALB 4.7 03/22/2022    TOTALPROTEIN 8.0 (H) 03/22/2022    -      Lab Results  Component Value Date    COLORU Light Yellow 03/22/2022    CLARITYU Clear 03/22/2022    SPECGRAV 1.024 03/22/2022    GLUCOSEU Trace (!) 03/22/2022    KETONESU Negative 03/22/2022  BLOODU 2+ (!) 03/22/2022    NITRITE Negative 03/22/2022    LEUKOCYTESUR Negative 03/22/2022    BILIRUBINUR Negative 03/22/2022    UROBILINOGEN 0.2 03/22/2022    RBCUA 8 (H) 03/22/2022    WBCUA 0 03/22/2022    SQUAMEPI 8 03/22/2022    BACTERIA 0-5 03/22/2022      RADS: N/a Assessment:       Acute cholecystitis [K81.0]   Plan:    1. Acute cholecystitis [K81.0] Recurrent episode.  Pending surgery, but due to pain will and lab values noted above, will plan on admission, move up OR date.  Pending respiratory panel as well due to upper respiratory symptoms.  Pain meds, oral abx in the meantime while waiting for bed placement.    labs/images/medications/previous chart entries  reviewed personally and relevant changes/updates noted above.

## 2022-03-23 ENCOUNTER — Other Ambulatory Visit: Payer: Self-pay

## 2022-03-23 ENCOUNTER — Other Ambulatory Visit: Payer: Medicaid Other

## 2022-03-23 ENCOUNTER — Encounter: Payer: Self-pay | Admitting: Surgery

## 2022-03-23 LAB — GLUCOSE, CAPILLARY
Glucose-Capillary: 131 mg/dL — ABNORMAL HIGH (ref 70–99)
Glucose-Capillary: 114 mg/dL — ABNORMAL HIGH (ref 70–99)
Glucose-Capillary: 74 mg/dL (ref 70–99)
Glucose-Capillary: 134 mg/dL — ABNORMAL HIGH (ref 70–99)

## 2022-03-23 LAB — HEMOGLOBIN A1C
Hgb A1c MFr Bld: 5.9 % — ABNORMAL HIGH (ref 4.8–5.6)
Mean Plasma Glucose: 122.63 mg/dL

## 2022-03-23 MED ORDER — CHLORHEXIDINE GLUCONATE CLOTH 2 % EX PADS
6.0000 | MEDICATED_PAD | Freq: Once | CUTANEOUS | Status: AC
  Administered 2022-03-23: 6 via TOPICAL

## 2022-03-23 MED ORDER — INDOCYANINE GREEN 25 MG IV SOLR
7.5000 mg | Freq: Once | INTRAVENOUS | Status: AC
  Administered 2022-03-24: 7.5 mg via INTRAVENOUS
  Filled 2022-03-23: qty 3

## 2022-03-23 MED ORDER — SODIUM CHLORIDE 0.9 % IV SOLN: INTRAVENOUS | Status: DC

## 2022-03-23 NOTE — Progress Notes (Signed)
Subjective:  CC: Kim Crawford is a 31 y.o. female  Hospital stay day 1,   acute cholecystitis  HPI: Feeling better, but pain still present.  ROS:  General: Denies weight loss, weight gain, fatigue, fevers, chills, and night sweats. Heart: Denies chest pain, palpitations, racing heart, irregular heartbeat, leg pain or swelling, and decreased activity tolerance. Respiratory: Denies breathing difficulty, shortness of breath, wheezing, cough, and sputum. GI: Denies change in appetite, heartburn, nausea, vomiting, constipation, diarrhea, and blood in stool. GU: Denies difficulty urinating, pain with urinating, urgency, frequency, blood in urine.   Objective:   Temp:  [97.5 F (36.4 C)-99 F (37.2 C)] 97.6 F (36.4 C) (10/11 0814) Pulse Rate:  [77-118] 77 (10/11 0814) Resp:  [15-18] 18 (10/11 0814) BP: (102-140)/(61-83) 107/75 (10/11 0814) SpO2:  [95 %-97 %] 96 % (10/11 0814) Weight:  [80.6 kg] 80.6 kg (10/10 2000)     Height: 5' 5"  (165.1 cm) Weight: 80.6 kg BMI (Calculated): 29.57   Intake/Output this shift:   Intake/Output Summary (Last 24 hours) at 03/23/2022 1516 Last data filed at 03/23/2022 1011 Gross per 24 hour  Intake 940.81 ml  Output --  Net 940.81 ml    Constitutional :  alert, cooperative, appears stated age, and no distress  Respiratory:  clear to auscultation bilaterally  Cardiovascular:  regular rate and rhythm  Gastrointestinal: soft, non-tender; bowel sounds normal; no masses,  no organomegaly.   Skin: Cool and moist.   Psychiatric: Normal affect, non-agitated, not confused       LABS:     Latest Ref Rng & Units 05/30/2019    3:59 AM 05/26/2019   11:29 AM 01/04/2018    3:13 AM  CMP  Glucose 70 - 99 mg/dL 122  137  162   BUN 6 - 20 mg/dL 14  11  11    Creatinine 0.44 - 1.00 mg/dL 0.61  0.55  0.64   Sodium 135 - 145 mmol/L 137  133  140   Potassium 3.5 - 5.1 mmol/L 3.9  3.8  3.8   Chloride 98 - 111 mmol/L 105  103  108   CO2 22 - 32 mmol/L 23   20  25    Calcium 8.9 - 10.3 mg/dL 8.7  9.1  8.6   Total Protein 6.5 - 8.1 g/dL  8.2    Total Bilirubin 0.3 - 1.2 mg/dL  1.3    Alkaline Phos 38 - 126 U/L  73    AST 15 - 41 U/L  39    ALT 0 - 44 U/L  64        Latest Ref Rng & Units 05/30/2019    3:59 AM 05/27/2019   12:23 PM 05/26/2019   11:29 AM  CBC  WBC 4.0 - 10.5 K/uL 10.1  8.3  12.4   Hemoglobin 12.0 - 15.0 g/dL 13.3  13.5  15.6   Hematocrit 36.0 - 46.0 % 36.9  38.8  44.9   Platelets 150 - 400 K/uL 175  168  197     RADS: N/a Assessment:   Acute cholecystitis, on IV abx, pain better controlled now.  Will proceed with lap chole tomorrow as previously discussed.  labs/images/medications/previous chart entries reviewed personally and relevant changes/updates noted above.

## 2022-03-23 NOTE — TOC Initial Note (Signed)
Transition of Care [facility]) - Initial/Assessment Note    Patient Details  Name: Kim Crawford MRN: [id number] Date of Birth: 11-15-1990  Transition of Care [facility]) CM/SW Contact:    Beverly Sessions, RN Phone Number: 03/23/2022, 10:19 AM  Clinical Narrative:                     Transition of Care (TOC) Screening Note   Patient Details  Name: Kim Crawford Date of Birth: [birth year]   Transition of Care [facility]) CM/SW Contact:    Beverly Sessions, RN Phone Number: 03/23/2022, 10:19 AM    Transition of Care Department [facility]) has reviewed patient and no TOC needs have been identified at this time. We will continue to monitor patient advancement through interdisciplinary progression rounds. If new patient transition needs arise, please place a TOC consult.       Patient Goals and CMS Choice        Expected Discharge Plan and Services                                                Prior Living Arrangements/Services                       Activities of Daily Living Home Assistive Devices/Equipment: CBG Meter ADL Screening (condition at time of admission) Patient's cognitive ability adequate to safely complete daily activities?: Yes Is the patient deaf or have difficulty hearing?: No Does the patient have difficulty seeing, even when wearing glasses/contacts?: No Does the patient have difficulty concentrating, remembering, or making decisions?: No Patient able to express need for assistance with ADLs?: Yes Does the patient have difficulty dressing or bathing?: No Independently performs ADLs?: Yes (appropriate for developmental age) Does the patient have difficulty walking or climbing stairs?: No Weakness of Legs: None Weakness of Arms/Hands: None  Permission Sought/Granted                  Emotional Assessment              Admission diagnosis:  Acute cholecystitis [K81.0] Patient Active Problem List   Diagnosis Date Noted    Acute cholecystitis 03/22/2022   Diabetes mellitus without complication ([place]) 70/26/3785   Ileitis, 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

## 2022-03-24 ENCOUNTER — Ambulatory Visit: Admission: RE | Admit: 2022-03-24 | Payer: Medicaid Other | Source: Ambulatory Visit | Admitting: Surgery

## 2022-03-24 ENCOUNTER — Encounter: Payer: Self-pay | Admitting: Surgery

## 2022-03-24 ENCOUNTER — Inpatient Hospital Stay: Payer: Medicaid Other | Admitting: Certified Registered Nurse Anesthetist

## 2022-03-24 ENCOUNTER — Other Ambulatory Visit: Payer: Self-pay

## 2022-03-24 ENCOUNTER — Encounter
Admission: RE | Disposition: A | Discharge status: Home / Self Care | Payer: Self-pay | Source: Ambulatory Visit | Attending: Surgery

## 2022-03-24 LAB — CREATININE, SERUM
Creatinine, Ser: 0.58 mg/dL (ref 0.44–1.00)
GFR, Estimated: >60 mL/min (ref >60)

## 2022-03-24 LAB — GLUCOSE, CAPILLARY
Glucose-Capillary: 119 mg/dL — ABNORMAL HIGH (ref 70–99)
Glucose-Capillary: 119 mg/dL — ABNORMAL HIGH (ref 70–99)
Glucose-Capillary: 134 mg/dL — ABNORMAL HIGH (ref 70–99)
Glucose-Capillary: 267 mg/dL — ABNORMAL HIGH (ref 70–99)
Glucose-Capillary: 272 mg/dL — ABNORMAL HIGH (ref 70–99)

## 2022-03-24 LAB — HIV ANTIBODY (ROUTINE TESTING W REFLEX): HIV Screen 4th Generation wRfx: NONREACTIVE

## 2022-03-24 LAB — CBC
HCT: 41.2 % (ref 36.0–46.0)
Hemoglobin: 13.9 g/dL (ref 12.0–15.0)
MCH: 30.1 pg (ref 26.0–34.0)
MCHC: 33.7 g/dL (ref 30.0–36.0)
MCV: 89.2 fL (ref 80.0–100.0)
Platelets: 165 10*3/uL (ref 150–400)
RBC: 4.62 MIL/uL (ref 3.87–5.11)
RDW: 12.7 % (ref 11.5–15.5)
WBC: 11.3 10*3/uL — ABNORMAL HIGH (ref 4.0–10.5)
nRBC: 0 % (ref 0.0–0.2)

## 2022-03-24 LAB — POCT PREGNANCY, URINE: Preg Test, Ur: NEGATIVE

## 2022-03-24 SURGERY — CHOLECYSTECTOMY, ROBOT-ASSISTED, LAPAROSCOPIC
Anesthesia: General | Site: Abdomen

## 2022-03-24 MED ORDER — ROCURONIUM BROMIDE 100 MG/10ML IV SOLN
INTRAVENOUS | Status: DC | PRN
  Administered 2022-03-24: 50 mg via INTRAVENOUS

## 2022-03-24 MED ORDER — ENOXAPARIN SODIUM 40 MG/0.4ML IJ SOSY: 40.0000 mg | PREFILLED_SYRINGE | INTRAMUSCULAR | Status: DC

## 2022-03-24 MED ORDER — MIDAZOLAM HCL 2 MG/2ML IJ SOLN
INTRAMUSCULAR | Status: AC
  Filled 2022-03-24: qty 2

## 2022-03-24 MED ORDER — MIDAZOLAM HCL 2 MG/2ML IJ SOLN
INTRAMUSCULAR | Status: DC | PRN
  Administered 2022-03-24: 2 mg via INTRAVENOUS

## 2022-03-24 MED ORDER — 0.9 % SODIUM CHLORIDE (POUR BTL) OPTIME
TOPICAL | Status: DC | PRN
  Administered 2022-03-24: 500 mL

## 2022-03-24 MED ORDER — FENTANYL CITRATE (PF) 100 MCG/2ML IJ SOLN
INTRAMUSCULAR | Status: DC | PRN
  Administered 2022-03-24 (×2): 50 ug via INTRAVENOUS

## 2022-03-24 MED ORDER — LIDOCAINE HCL (CARDIAC) PF 100 MG/5ML IV SOSY
PREFILLED_SYRINGE | INTRAVENOUS | Status: DC | PRN
  Administered 2022-03-24: 100 mg via INTRAVENOUS

## 2022-03-24 MED ORDER — FENTANYL CITRATE (PF) 100 MCG/2ML IJ SOLN
25.0000 ug | INTRAMUSCULAR | Status: DC | PRN
  Administered 2022-03-24: 50 ug via INTRAVENOUS
  Administered 2022-03-24 (×2): 25 ug via INTRAVENOUS

## 2022-03-24 MED ORDER — BUPROPION HCL ER (XL) 150 MG PO TB24
150.0000 mg | ORAL_TABLET | Freq: Every day | ORAL | Status: DC
  Administered 2022-03-24: 150 mg via ORAL
  Filled 2022-03-24: qty 1

## 2022-03-24 MED ORDER — ACETAMINOPHEN 325 MG PO TABS: 650.0000 mg | ORAL_TABLET | Freq: Three times a day (TID) | ORAL | 0 refills | Status: AC | PRN

## 2022-03-24 MED ORDER — IBUPROFEN 800 MG PO TABS: 800.0000 mg | ORAL_TABLET | Freq: Three times a day (TID) | ORAL | 0 refills | Status: DC | PRN

## 2022-03-24 MED ORDER — PROPOFOL 10 MG/ML IV BOLUS
INTRAVENOUS | Status: DC | PRN
  Administered 2022-03-24: 200 mg via INTRAVENOUS

## 2022-03-24 MED ORDER — DOCUSATE CALCIUM 240 MG PO CAPS: 240.0000 mg | ORAL_CAPSULE | Freq: Every day | ORAL | 0 refills | Status: DC

## 2022-03-24 MED ORDER — FENTANYL CITRATE (PF) 100 MCG/2ML IJ SOLN
INTRAMUSCULAR | Status: AC
  Filled 2022-03-24: qty 2

## 2022-03-24 MED ORDER — PROPOFOL 10 MG/ML IV BOLUS
INTRAVENOUS | Status: AC
  Filled 2022-03-24: qty 40

## 2022-03-24 MED ORDER — PHENYLEPHRINE 80 MCG/ML (10ML) SYRINGE FOR IV PUSH (FOR BLOOD PRESSURE SUPPORT)
PREFILLED_SYRINGE | INTRAVENOUS | Status: DC | PRN
  Administered 2022-03-24 (×2): 160 ug via INTRAVENOUS

## 2022-03-24 MED ORDER — OXYCODONE HCL 5 MG PO TABS: 5.0000 mg | ORAL_TABLET | Freq: Once | ORAL | Status: DC | PRN

## 2022-03-24 MED ORDER — ONDANSETRON HCL 4 MG/2ML IJ SOLN
INTRAMUSCULAR | Status: DC | PRN
  Administered 2022-03-24: 4 mg via INTRAVENOUS

## 2022-03-24 MED ORDER — ACETAMINOPHEN 10 MG/ML IV SOLN
INTRAVENOUS | Status: DC | PRN
  Administered 2022-03-24: 1000 mg via INTRAVENOUS

## 2022-03-24 MED ORDER — LIDOCAINE HCL (PF) 1 % IJ SOLN
INTRAMUSCULAR | Status: AC
  Filled 2022-03-24: qty 30

## 2022-03-24 MED ORDER — SUCCINYLCHOLINE CHLORIDE 200 MG/10ML IV SOSY
PREFILLED_SYRINGE | INTRAVENOUS | Status: DC | PRN
  Administered 2022-03-24: 100 mg via INTRAVENOUS

## 2022-03-24 MED ORDER — DEXAMETHASONE SODIUM PHOSPHATE 10 MG/ML IJ SOLN
INTRAMUSCULAR | Status: DC | PRN
  Administered 2022-03-24: 8 mg via INTRAVENOUS

## 2022-03-24 MED ORDER — SUGAMMADEX SODIUM 200 MG/2ML IV SOLN
INTRAVENOUS | Status: DC | PRN
  Administered 2022-03-24: 200 mg via INTRAVENOUS

## 2022-03-24 MED ORDER — OXYCODONE HCL 5 MG/5ML PO SOLN: 5.0000 mg | Freq: Once | ORAL | Status: DC | PRN

## 2022-03-24 MED ORDER — ACETAMINOPHEN 10 MG/ML IV SOLN
INTRAVENOUS | Status: AC
  Filled 2022-03-24: qty 100

## 2022-03-24 MED ORDER — LIDOCAINE HCL (PF) 1 % IJ SOLN
INTRAMUSCULAR | Status: DC | PRN
  Administered 2022-03-24: 34 mL

## 2022-03-24 MED ORDER — CHLORHEXIDINE GLUCONATE 0.12 % MT SOLN
OROMUCOSAL | Status: AC
  Administered 2022-03-24: 15 mL via OROMUCOSAL
  Filled 2022-03-24: qty 15

## 2022-03-24 MED ORDER — CHLORHEXIDINE GLUCONATE 0.12 % MT SOLN: 15.0000 mL | Freq: Once | OROMUCOSAL | Status: AC

## 2022-03-24 MED ORDER — PROPOFOL 10 MG/ML IV BOLUS
INTRAVENOUS | Status: AC
  Filled 2022-03-24: qty 20

## 2022-03-24 MED ORDER — BUPIVACAINE-EPINEPHRINE (PF) 0.5% -1:200000 IJ SOLN
INTRAMUSCULAR | Status: AC
  Filled 2022-03-24: qty 30

## 2022-03-24 SURGICAL SUPPLY — 52 items
ANCHOR TIS RET SYS 235ML (MISCELLANEOUS) ×1 IMPLANT
BAG PRESSURE INF REUSE 1000 (BAG) IMPLANT
BLADE SURG SZ11 CARB STEEL (BLADE) ×1 IMPLANT
CANNULA REDUC XI 12-8 STAPL (CANNULA) ×1
CANNULA REDUCER 12-8 DVNC XI (CANNULA) ×1 IMPLANT
CATH REDDICK CHOLANGI 4FR 50CM (CATHETERS) IMPLANT
CLIP LIGATING HEMO O LOK GREEN (MISCELLANEOUS) ×1 IMPLANT
DERMABOND ADVANCED .7 DNX12 (GAUZE/BANDAGES/DRESSINGS) ×1 IMPLANT
DRAPE ARM DVNC X/XI (DISPOSABLE) ×4 IMPLANT
DRAPE C-ARM XRAY 36X54 (DRAPES) IMPLANT
DRAPE COLUMN DVNC XI (DISPOSABLE) ×1 IMPLANT
DRAPE DA VINCI XI ARM (DISPOSABLE) ×4
DRAPE DA VINCI XI COLUMN (DISPOSABLE) ×1
ELECT CAUTERY BLADE 6.4 (BLADE) ×1 IMPLANT
ELECT REM PT RETURN 9FT ADLT (ELECTROSURGICAL) ×1
ELECTRODE REM PT RTRN 9FT ADLT (ELECTROSURGICAL) ×1 IMPLANT
GLOVE BIOGEL PI IND STRL 7.0 (GLOVE) ×2 IMPLANT
GLOVE SURG SYN 6.5 ES PF (GLOVE) ×2 IMPLANT
GLOVE SURG SYN 6.5 PF PI (GLOVE) ×2 IMPLANT
GOWN STRL REUS W/ TWL LRG LVL3 (GOWN DISPOSABLE) ×3 IMPLANT
GOWN STRL REUS W/TWL LRG LVL3 (GOWN DISPOSABLE) ×3
GRASPER SUT TROCAR 14GX15 (MISCELLANEOUS) IMPLANT
IRRIGATOR SUCT 8 DISP DVNC XI (IRRIGATION / IRRIGATOR) IMPLANT
IRRIGATOR SUCTION 8MM XI DISP (IRRIGATION / IRRIGATOR) ×1
IV NS 1000ML (IV SOLUTION)
IV NS 1000ML BAXH (IV SOLUTION) IMPLANT
LABEL OR SOLS (LABEL) ×1 IMPLANT
MANIFOLD NEPTUNE II (INSTRUMENTS) ×1 IMPLANT
NDL INSUFFLATION 14GA 120MM (NEEDLE) ×1 IMPLANT
NEEDLE HYPO 22GX1.5 SAFETY (NEEDLE) ×1 IMPLANT
NEEDLE INSUFFLATION 14GA 120MM (NEEDLE) ×1 IMPLANT
NS IRRIG 500ML POUR BTL (IV SOLUTION) ×1 IMPLANT
OBTURATOR OPTICAL STANDARD 8MM (TROCAR) ×1
OBTURATOR OPTICAL STND 8 DVNC (TROCAR) ×1
OBTURATOR OPTICALSTD 8 DVNC (TROCAR) ×1 IMPLANT
PACK LAP CHOLECYSTECTOMY (MISCELLANEOUS) ×1 IMPLANT
PENCIL SMOKE EVACUATOR (MISCELLANEOUS) ×1 IMPLANT
SEAL CANN UNIV 5-8 DVNC XI (MISCELLANEOUS) ×3 IMPLANT
SEAL XI 5MM-8MM UNIVERSAL (MISCELLANEOUS) ×3
SET TUBE SMOKE EVAC HIGH FLOW (TUBING) ×1 IMPLANT
SOLUTION ELECTROLUBE (MISCELLANEOUS) ×1 IMPLANT
SPIKE FLUID TRANSFER (MISCELLANEOUS) ×2 IMPLANT
STAPLER CANNULA SEAL DVNC XI (STAPLE) ×1 IMPLANT
STAPLER CANNULA SEAL XI (STAPLE) ×1
SUT MNCRL 4-0 (SUTURE) ×1
SUT MNCRL 4-0 27XMFL (SUTURE) ×1
SUT VICRYL 0 AB UR-6 (SUTURE) ×1 IMPLANT
SUTURE MNCRL 4-0 27XMF (SUTURE) ×2 IMPLANT
SYR 30ML LL (SYRINGE) IMPLANT
SYSTEM WECK SHIELD CLOSURE (TROCAR) IMPLANT
TRAP FLUID SMOKE EVACUATOR (MISCELLANEOUS) ×1 IMPLANT
WATER STERILE IRR 500ML POUR (IV SOLUTION) ×1 IMPLANT

## 2022-03-24 NOTE — Anesthesia Postprocedure Evaluation (Signed)
Anesthesia Post Note  Patient: Kim Crawford  Procedure(s) Performed: XI ROBOTIC ASSISTED LAPAROSCOPIC CHOLECYSTECTOMY (Abdomen) INDOCYANINE GREEN FLUORESCENCE IMAGING (ICG) (Abdomen)  Patient location during evaluation: PACU Anesthesia Type: General Level of consciousness: awake and alert Pain management: pain level controlled Vital Signs Assessment: post-procedure vital signs reviewed and stable Respiratory status: spontaneous breathing, nonlabored ventilation, respiratory function stable and patient connected to nasal cannula oxygen Cardiovascular status: blood pressure returned to baseline and stable Postop Assessment: no apparent nausea or vomiting Anesthetic complications: no   No notable events documented.   Last Vitals:  Vitals:   03/24/22 1130 03/24/22 1200  BP: 107/72 117/79  Pulse: 82 84  Resp:    Temp: 36.7 C 36.6 C  SpO2: 94% 90%    Last Pain:  Vitals:   03/24/22 1238  TempSrc:   PainSc: 4                  Precious Haws Tannya Gonet

## 2022-03-24 NOTE — Discharge Instructions (Signed)
Laparoscopic Cholecystectomy, Care After This sheet gives you information about how to care for yourself after your procedure. Your doctor may also give you more specific instructions. If you have problems or questions, contact your doctor. Follow these instructions at home: Care for cuts from surgery (incisions)  Follow instructions from your doctor about how to take care of your cuts from surgery. Make sure you: Wash your hands with soap and water before you change your bandage (dressing). If you cannot use soap and water, use hand sanitizer. Change your bandage as told by your doctor. Leave stitches (sutures), skin glue, or skin tape (adhesive) strips in place. They may need to stay in place for 2 weeks or longer. If tape strips get loose and curl up, you may trim the loose edges. Do not remove tape strips completely unless your doctor says it is okay. Do not take baths, swim, or use a hot tub until your doctor says it is okay. OK TO SHOWER 24HRS AFTER YOUR SURGERY.  Check your surgical cut area every day for signs of infection. Check for: More redness, swelling, or pain. More fluid or blood. Warmth. Pus or a bad smell. Activity Do not drive or use heavy machinery while taking prescription pain medicine. Do not play contact sports until your doctor says it is okay. Do not drive for 24 hours if you were given a medicine to help you relax (sedative). Rest as needed. Do not return to work or school until your doctor says it is okay. General instructions  tylenol and advil as needed for discomfort.  Please alternate between the two every four hours as needed for pain.    Use narcotics, if prescribed, only when tylenol and motrin is not enough to control pain.  325-644m every 8hrs to max of 30033m24hrs (including the 32569mn every norco dose) for the tylenol.    Advil up to 800m47mr dose every 8hrs as needed for pain.   To prevent or treat constipation while you are taking prescription  pain medicine, your doctor may recommend that you: Drink enough fluid to keep your pee (urine) clear or pale yellow. Take over-the-counter or prescription medicines. Eat foods that are high in fiber, such as fresh fruits and vegetables, whole grains, and beans. Limit foods that are high in fat and processed sugars, such as fried and sweet foods. Contact a doctor if: You develop a rash. You have more redness, swelling, or pain around your surgical cuts. You have more fluid or blood coming from your surgical cuts. Your surgical cuts feel warm to the touch. You have pus or a bad smell coming from your surgical cuts. You have a fever. One or more of your surgical cuts breaks open. You have trouble breathing. You have chest pain. You have pain that is getting worse in your shoulders. You faint or feel dizzy when you stand. You have very bad pain in your belly (abdomen). You are sick to your stomach (nauseous) for more than one day. You have throwing up (vomiting) that lasts for more than one day. You have leg pain. This information is not intended to replace advice given to you by your health care provider. Make sure you discuss any questions you have with your health care provider. Document Released: 03/08/2008 Document Revised: 12/19/2015 Document Reviewed: 11/16/2015 Elsevier Interactive Patient Education  2019 [employer]

## 2022-03-24 NOTE — Plan of Care (Signed)
  Problem: Metabolic: Goal: Ability to maintain appropriate glucose levels will improve Outcome: Progressing   Problem: Nutritional: Goal: Maintenance of adequate nutrition will improve Outcome: Progressing   Problem: Clinical Measurements: Goal: Will remain free from infection Outcome: Progressing

## 2022-03-24 NOTE — Anesthesia Procedure Notes (Signed)
Procedure Name: Intubation Date/Time: 03/24/2022 9:12 AM  Performed by: Lowry Bowl, CRNAPre-anesthesia Checklist: Patient identified, Emergency Drugs available, Suction available and Patient being monitored Patient Re-evaluated:Patient Re-evaluated prior to induction Oxygen Delivery Method: Circle system utilized Preoxygenation: Pre-oxygenation with 100% oxygen Induction Type: IV induction, Cricoid Pressure applied and Rapid sequence Laryngoscope Size: 3 and McGraph Grade View: Grade I Tube type: Oral Tube size: 6.5 mm Number of attempts: 1 Airway Equipment and Method: Stylet and Video-laryngoscopy Placement Confirmation: ETT inserted through vocal cords under direct vision, positive ETCO2 and breath sounds checked- equal and bilateral Secured at: 21 cm Tube secured with: Tape Dental Injury: Teeth and Oropharynx as per pre-operative assessment

## 2022-03-24 NOTE — Op Note (Signed)
Preoperative diagnosis:  chronic and cholecystitis  Postoperative diagnosis: same as above  Procedure: Robotic assisted Laparoscopic Cholecystectomy.   Anesthesia: GETA   Surgeon: Benjamine Sprague  Specimen: Gallbladder  Complications: None  EBL: 23m  Wound Classification: Clean Contaminated  Indications: see HPI  Findings: Critical view of safety noted Cystic duct and artery identified, ligated and divided, clips remained intact at end of procedure Adequate hemostasis  Description of procedure:  The patient was placed on the operating table in the supine position. SCDs placed, pre-op abx administered.  General anesthesia was induced and OG tube placed by anesthesia. A time-out was completed verifying correct patient, procedure, site, positioning, and implant(s) and/or special equipment prior to beginning this procedure. The abdomen was prepped and draped in the usual sterile fashion.    Veress needle was placed at the Palmer's point and insufflation was started after confirming a positive saline drop test and no immediate increase in abdominal pressure.  After reaching 15 mm, the Veress needle was removed and a 8 mm port was placed via optiview technique under umbilicus measured 217BLfrom gallbladder.  The abdomen was inspected and no abnormalities or injuries were found.  Under direct vision, ports were placed in the following locations: One 12 mm patient left of the umbilicus, 8cm from the optiviewed port, one 8 mm port placed to the patient right of the umbilical port 8 cm apart.  1 additional 8 mm port placed lateral to the 186mport.  Once ports were placed, The table was placed in the reverse Trendelenburg position with the right side up. The Xi platform was brought into the operative field and docked to the ports successfully.  An endoscope was placed through the umbilical port, fenestrated grasper through the adjacent patient right port, prograsp to the far patient left port, and  then a hook cautery in the left port.  The dome of the gallbladder was grasped with prograsp, passed and retracted over the dome of the liver. Adhesions between the gallbladder and omentum, duodenum and transverse colon were lysed via hook cautery. The infundibulum was grasped with the fenestrated grasper and retracted toward the right lower quadrant. This maneuver exposed Calot's triangle. The peritoneum overlying the gallbladder infundibulum was then dissected  and the cystic duct and cystic artery identified.  Critical view of safety with the liver bed clearly visible behind the duct and artery with no additional structures noted.  The cystic duct and cystic artery clipped and divided close to the gallbladder.     The gallbladder was then dissected from its peritoneal and liver bed attachments by electrocautery. During dissection, bile leak noted from gallbladder, bile suctioned out. Hemostasis was checked prior to removing the hook cautery and the Endo Catch bag was then placed through the 12 mm port and the gallbladder was removed.  The gallbladder was passed off the table as a specimen. There was no evidence of bleeding from the gallbladder fossa or cystic artery or leakage of the bile from the cystic duct stump. The 12 mm port site closed with Efx Shield using 0 vicryl under direct vision.  Abdomen desufflated and secondary trocars were removed under direct vision. No bleeding was noted. All skin incisions then closed with subcuticular sutures of 4-0 monocryl and dressed with topical skin adhesive. The orogastric tube was removed and patient extubated.  The patient tolerated the procedure well and was taken to the postanesthesia care unit in stable condition.  All sponge and instrument count correct at end of procedure.

## 2022-03-24 NOTE — Transfer of Care (Signed)
Immediate Anesthesia Transfer of Care Note  Patient: Ailene Ards  Procedure(s) Performed: XI ROBOTIC ASSISTED LAPAROSCOPIC CHOLECYSTECTOMY (Abdomen) INDOCYANINE GREEN FLUORESCENCE IMAGING (ICG) (Abdomen)  Patient Location: PACU  Anesthesia Type:General  Level of Consciousness: awake, drowsy and patient cooperative  Airway & Oxygen Therapy: Patient Spontanous Breathing and Patient connected to face mask oxygen  Post-op Assessment: Report given to RN and Post -op Vital signs reviewed and stable  Post vital signs: Reviewed and stable  Last Vitals:  Vitals Value Taken Time  BP 140/8 03/24/22 1033  Temp 36.1 C 03/24/22 1033  Pulse 82 03/24/22 1035  Resp 14 03/24/22 1035  SpO2 100 % 03/24/22 1035  Vitals shown include unvalidated device data.  Last Pain:  Vitals:   03/24/22 1033  TempSrc:   PainSc: 0-No pain      Patients Stated Pain Goal: 0 (51/89/84 2103)  Complications: No notable events documented.

## 2022-03-24 NOTE — Anesthesia Preprocedure Evaluation (Signed)
Anesthesia Evaluation  Patient identified by MRN, date of birth, ID band Patient awake    Reviewed: Allergy & Precautions, NPO status , Patient's Chart, lab work & pertinent test results  History of Anesthesia Complications Negative for: history of anesthetic complications  Airway Mallampati: III  TM Distance: <3 FB Neck ROM: full    Dental  (+) Chipped, Poor Dentition, Missing   Pulmonary neg shortness of breath, Current Smoker and Patient abstained from smoking.,    Pulmonary exam normal        Cardiovascular Exercise Tolerance: Good hypertension, (-) angina(-) Past MI Normal cardiovascular exam     Neuro/Psych  Headaches, negative psych ROS   GI/Hepatic negative GI ROS, Neg liver ROS, neg GERD  ,  Endo/Other  negative endocrine ROSdiabetes, Type 2  Renal/GU      Musculoskeletal   Abdominal   Peds  Hematology negative hematology ROS (+)   Anesthesia Other Findings Past Medical History: 2014: Breast mass, right No date: Diabetes mellitus without complication (HCC) No date: Eczema No date: Headache     Comment:  migaines No date: Hypertension No date: PCOS (polycystic ovarian syndrome)  Past Surgical History: 2014: BREAST SURGERY; Right     Comment:  breast mass 2011: CESAREAN SECTION 05/29/2019: COLONOSCOPY WITH PROPOFOL; N/A     Comment:  Procedure: COLONOSCOPY WITH PROPOFOL;  Surgeon: Lin Landsman, MD;  Location: [facility] ENDOSCOPY;  Service:               Gastroenterology;  Laterality: N/A; 09/23/2021: LEEP; N/A     Comment:  Procedure: LOOP ELECTROSURGICAL EXCISION PROCEDURE               (LEEP);  Surgeon: Schermerhorn, Gwen Her, MD;  Location:               [facility] ORS;  Service: Gynecology;  Laterality: N/A; 02/27/2018: PILONIDAL CYST EXCISION; N/A     Comment:  Procedure: CYST EXCISION PILONIDAL SIMPLE;  Surgeon:               Jules Husbands, MD;  Location: [facility] ORS;  Service:                General;  Laterality: N/A;  BMI    Body Mass Index: 29.45 kg/m      Reproductive/Obstetrics negative OB ROS                             Anesthesia Physical Anesthesia Plan  ASA: 3  Anesthesia Plan: General ETT and Rapid Sequence   Post-op Pain Management:    Induction: Intravenous  PONV Risk Score and Plan: Ondansetron, Dexamethasone, Midazolam and Treatment may vary due to age or medical condition  Airway Management Planned: Oral ETT  Additional Equipment:   Intra-op Plan:   Post-operative Plan: Extubation in OR  Informed Consent: I have reviewed the patients History and Physical, chart, labs and discussed the procedure including the risks, benefits and alternatives for the proposed anesthesia with the patient or authorized representative who has indicated his/her understanding and acceptance.     Dental Advisory Given  Plan Discussed with: Anesthesiologist, CRNA and Surgeon  Anesthesia Plan Comments: (Patient reports active GLP-1 agonist use.  After discussing (with both the patient and the surgeon) the risks of proceeding (aspiration, lung damage and death) and given the option to reschedule the patient declines to reschedule  and assents to proceed with rapid sequence intubation for the procedure per ASA guidance.    Patient consented for risks of anesthesia including but not limited to:  - adverse reactions to medications - damage to eyes, teeth, lips or other oral mucosa - nerve damage due to positioning  - sore throat or hoarseness - Damage to heart, brain, nerves, lungs, other parts of body or loss of life  Patient voiced understanding.)        Anesthesia Quick Evaluation

## 2022-03-25 LAB — CBC
HCT: 43.3 % (ref 36.0–46.0)
Hemoglobin: 14.6 g/dL (ref 12.0–15.0)
MCH: 29.9 pg (ref 26.0–34.0)
MCHC: 33.7 g/dL (ref 30.0–36.0)
MCV: 88.7 fL (ref 80.0–100.0)
Platelets: 222 10*3/uL (ref 150–400)
RBC: 4.88 MIL/uL (ref 3.87–5.11)
RDW: 12.4 % (ref 11.5–15.5)
WBC: 18 10*3/uL — ABNORMAL HIGH (ref 4.0–10.5)
nRBC: 0 % (ref 0.0–0.2)

## 2022-03-25 LAB — BASIC METABOLIC PANEL
Anion gap: 7 (ref 5–15)
BUN: 13 mg/dL (ref 6–20)
CO2: 25 mmol/L (ref 22–32)
Calcium: 9.5 mg/dL (ref 8.9–10.3)
Chloride: 106 mmol/L (ref 98–111)
Creatinine, Ser: 0.58 mg/dL (ref 0.44–1.00)
GFR, Estimated: >60 mL/min (ref >60)
Glucose, Bld: 140 mg/dL — ABNORMAL HIGH (ref 70–99)
Potassium: 3.9 mmol/L (ref 3.5–5.1)
Sodium: 138 mmol/L (ref 135–145)

## 2022-03-25 LAB — SURGICAL PATHOLOGY

## 2022-03-25 LAB — GLUCOSE, CAPILLARY: Glucose-Capillary: 216 mg/dL — ABNORMAL HIGH (ref 70–99)

## 2022-03-25 NOTE — Discharge Summary (Signed)
Physician Discharge Summary  Patient ID: Kim Crawford MRN: [id number] DOB/AGE: [birth year] 31 y.o.  Admit date: 03/22/2022 Discharge date: March 25, 2022  Admission Diagnoses: Chronic cholecystitis  Discharge Diagnoses:  Same as above  Discharged Condition: good  Hospital Course: admitted for above directly from office after patient called for recurrent symptoms.  Pain control and antibiotics started prior to undergoing surgery.  Underwent surgery.  Please see op note for details.  Post op, recovered as expected.  At time of d/c, tolerating diet and pain controlled  Consults: None  Discharge Exam: Blood pressure 126/84, pulse 89, temperature 97.9 F (36.6 C), temperature source Oral, resp. rate 18, height 5' 5" (1.651 m), weight 80.3 kg, SpO2 94 %. General appearance: alert, cooperative, and no distress GI: Soft, no guarding, focal tenderness in right upper quadrant and incision sites as expected.  Disposition:  Discharge disposition: 01-Home or Self Care        Allergies as of 03/25/2022       Reactions   Toradol [ketorolac Tromethamine] Other (See Comments)   "Suicide headaches"   Tramadol Other (See Comments)   Severe headaches   Hydrocodone Other (See Comments)   Severe migraine headaches (Can take in small doses)   Penicillins Rash, Other (See Comments)   Has patient had a PCN reaction causing immediate rash, facial/tongue/throat swelling, SOB or lightheadedness with hypotension: Unknown Has patient had a PCN reaction causing severe rash involving mucus membranes or skin necrosis: No Has patient had a PCN reaction that required hospitalization: No Has patient had a PCN reaction occurring within the last 10 years: No If all of the above answers are "NO", then may proceed with Cephalosporin use.        Medication List     STOP taking these medications    ciprofloxacin 500 MG tablet Commonly known as: CIPRO   cyclobenzaprine 10 MG tablet Commonly  known as: FLEXERIL   metroNIDAZOLE 500 MG tablet Commonly known as: FLAGYL   multivitamin-prenatal 27-0.8 MG Tabs tablet       TAKE these medications    acetaminophen 325 MG tablet Commonly known as: Tylenol Take 2 tablets (650 mg total) by mouth every 8 (eight) hours as needed for mild pain.   blood glucose meter kit and supplies Kit Dispense based on patient and insurance preference. Use up to four times daily as directed. (FOR ICD-9 250.00, 250.01).   buPROPion 150 MG 24 hr tablet Commonly known as: WELLBUTRIN XL Take 150 mg by mouth daily.   docusate calcium 240 MG capsule Commonly known as: SURFAK Take 1 capsule (240 mg total) by mouth daily.   FreeStyle Libre 14 Day Sensor Misc by Does not apply route.   gemfibrozil 600 MG tablet Commonly known as: LOPID Take 600 mg by mouth in the morning and at bedtime.   ibuprofen 800 MG tablet Commonly known as: ADVIL Take 1 tablet (800 mg total) by mouth every 8 (eight) hours as needed for mild pain or moderate pain. What changed:  medication strength how much to take when to take this reasons to take this   metFORMIN 500 MG tablet Commonly known as: GLUCOPHAGE Take 500 mg by mouth in the morning and at bedtime.   oxyCODONE-acetaminophen 5-325 MG tablet Commonly known as: PERCOCET/ROXICET Take 1 tablet by mouth 4 (four) times daily as needed.   Ozempic (1 MG/DOSE) 4 MG/3ML Sopn Generic drug: Semaglutide (1 MG/DOSE) Inject 1 mg into the skin once a week.   propranolol 20 MG  tablet Commonly known as: INDERAL Take 20 mg by mouth 2 (two) times daily.        Follow-up Information     , , DO Follow up in 2 week(s).   Specialty: Surgery Why: post op lap chole Contact information: [address] [place] [address] [phone]                  Total time spent arranging discharge was >30min. Signed:   03/25/2022, 8:20 PM  

## 2022-04-01 ENCOUNTER — Ambulatory Visit: Admit: 2022-04-01 | Payer: Medicaid Other | Admitting: Surgery

## 2022-04-01 SURGERY — CHOLECYSTECTOMY, ROBOT-ASSISTED, LAPAROSCOPIC
Anesthesia: General

## 2022-04-01 SURGERY — CHOLECYSTECTOMY, ROBOT-ASSISTED, LAPAROSCOPIC
Anesthesia: General | Site: Abdomen

## 2022-04-13 ENCOUNTER — Other Ambulatory Visit: Payer: Medicaid Other

## 2022-04-13 NOTE — H&P (Signed)
Kim Crawford is a 31 y.o. 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  Past Surgical History:  has a past surgical history that includes Cesarean section and Cervical biopsy w/ loop electrode excision (09/2021). Family History: family history is not on file. She was adopted. Social History:  reports that she has been smoking cigarettes. She has never used smokeless tobacco. She reports current alcohol use. She reports that she does not use drugs. OB/GYN History:  OB History       Gravida  1   Para  1   Term  1   Preterm      AB      Living  1        SAB      IAB      Ectopic      Molar      Multiple      Live Births  1             Allergies: is allergic to penicillins, ketorolac tromethamine, tramadol, (d)-limonene flavor, and hydrocodone. Medications:   Current Outpatient Medications:    buPROPion (WELLBUTRIN XL) 150 MG XL tablet, Take 1 tablet (150 mg total) by mouth once daily, Disp: 30 tablet, Rfl: 11   cyclobenzaprine (FLEXERIL) 10 MG tablet, , Disp: , Rfl:    flash glucose sensor (FREESTYLE LIBRE 14 DAY SENSOR) kit, Inject 1 kit subcutaneously as directed for glucose monitoring, Disp: 2 kit, Rfl: 3   gemfibroziL (LOPID) 600 mg tablet, Take 1 tablet (600 mg total) by mouth 2 (two) times daily before meals, Disp: 60 tablet, Rfl: 11   metFORMIN (GLUCOPHAGE) 500 MG tablet, Take 1 tablet (500 mg total) by mouth 2 (two) times daily with meals, Disp: 60 tablet, Rfl: 11   propranoloL (INDERAL) 20 MG tablet, Take 1 tablet (20 mg total) by mouth 2 (two) times daily, Disp: 60 tablet, Rfl: 11   semaglutide (OZEMPIC) 0.25 mg or 0.5 mg (2  mg/3 mL) pen injector, Inject 0.75 mLs (0.5 mg total) subcutaneously once a week, Disp: 3 mL, Rfl: 3   Review of Systems: General:                      No fatigue or weight loss Eyes:                           No vision changes Ears:                            No hearing difficulty Respiratory:                No cough or shortness of breath Pulmonary:                  No asthma or shortness of breath Cardiovascular:           No chest pain, palpitations, dyspnea on exertion Gastrointestinal:          No abdominal bloating, chronic diarrhea, constipations, masses, pain or hematochezia Genitourinary:  No hematuria, dysuria, abnormal vaginal discharge, pelvic pain, Menometrorrhagia Lymphatic:                   No swollen lymph nodes Musculoskeletal:         No muscle weakness Neurologic:                  No extremity weakness, syncope, seizure disorder Psychiatric:                  No history of depression, delusions or suicidal/homicidal ideation      Exam:       Vitals:    11/2/230855  BP: 113/83  Pulse: 101      Body mass index is 31.72 kg/m.   WDWN white/ female in NAD   Lungs: CTA  CV : RRR without murmur   Breast: exam done in sitting and lying position : No dimpling or retraction, no dominant mass, no spontaneous discharge, no axillary adenopathy Neck:  no thyromegaly Abdomen: soft , no mass, normal active bowel sounds,  non-tender, no rebound tenderness Pelvic: tanner stage 5 ,  External genitalia: vulva /labia no lesions Urethra: no prolapse Vagina: normal physiologic d/c, adequate room for LAVH if needed  Cervix: no lesions, no cervical motion tenderness   Uterus: normal size shape and contour, non-tender Adnexa: no mass,  non-tender   Rectovaginal:  Impression:    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 Pro + cons  discussed for both . She has opted for LAVH and bilateral salpingectomy      the risks of the procedure have been discussed , see [facility] notes      No follow-ups on file.   Caroline Sauger, MD

## 2022-04-15 ENCOUNTER — Other Ambulatory Visit: Payer: Medicaid Other

## 2022-04-15 ENCOUNTER — Other Ambulatory Visit: Payer: Self-pay

## 2022-04-15 ENCOUNTER — Encounter
Admission: RE | Admit: 2022-04-15 | Discharge: 2022-04-15 | Disposition: A | Discharge status: Home / Self Care | Payer: Medicaid Other | Source: Ambulatory Visit | Attending: Obstetrics and Gynecology | Admitting: Obstetrics and Gynecology

## 2022-04-15 DIAGNOSIS — Z01812 Encounter for preprocedural laboratory examination: Secondary | ICD-10-CM

## 2022-04-15 NOTE — Patient Instructions (Addendum)
Your procedure is scheduled on: 04/22/22 - Friday  Report to the Registration Desk on the 1st floor of the [facility]. To find out your arrival time, please call [phone] between 1PM - 3PM on: 04/21/22 - Thursday If your arrival time is 6:00 am, do not arrive prior to that time as the [facility] entrance doors do not open until 6:00 am.  REMEMBER: Instructions that are not followed completely may result in serious medical risk, up to and including death; or upon the discretion of your surgeon and anesthesiologist your surgery may need to be rescheduled.  Do not eat food after midnight the night before surgery.  No gum chewing, lozengers or hard candies.  You may however, drink CLEAR liquids up to 2 hours before you are scheduled to arrive for your surgery. Do not drink anything within 2 hours of your scheduled arrival time. Type 1 and Type 2 diabetics should only drink water.  In addition, your doctor has ordered for you to drink the provided   Gatorade G2 Drinking this carbohydrate drink up to two hours before surgery helps to reduce insulin resistance and improve patient outcomes. Please complete drinking 2 hours prior to scheduled arrival time.  TAKE THESE MEDICATIONS THE MORNING OF SURGERY WITH A SIP OF WATER:  - buPROPion (WELLBUTRIN XL)  - gemfibrozil (LOPID)  - propranolol (INDERAL)   HOLD metFORMIN (GLUCOPHAGE) 2 days prior to your surgery beginning 04/20/22.  HOLD OZEMPIC 7 days prior to your procedure.  One week prior to surgery: Stop Anti-inflammatories (NSAIDS) such as Advil, Aleve, Ibuprofen, Motrin, Naproxen, Naprosyn and Aspirin based products such as Excedrin, Goodys Powder, BC Powder.  Stop ANY OVER THE COUNTER supplements until after surgery.  You may however, continue to take Tylenol if needed for pain up until the day of surgery.  No Alcohol for 24 hours before or after surgery.  No Smoking including e-cigarettes for 24 hours prior to surgery.  No  chewable tobacco products for at least 6 hours prior to surgery.  No nicotine patches on the day of surgery.  Do not use any "recreational" drugs for at least a week prior to your surgery.  Please be advised that the combination of cocaine and anesthesia may have negative outcomes, up to and including death. If you test positive for cocaine, your surgery will be cancelled.  On the morning of surgery brush your teeth with toothpaste and water, you may rinse your mouth with mouthwash if you wish. Do not swallow any toothpaste or mouthwash.  Use CHG Soap or wipes as directed on instruction sheet.  Do not wear jewelry, make-up, hairpins, clips or nail polish.  Do not wear lotions, powders, or perfumes.   Do not shave body from the neck down 48 hours prior to surgery just in case you cut yourself which could leave a site for infection.  Also, freshly shaved skin may become irritated if using the CHG soap.  Contact lenses, hearing aids and dentures may not be worn into surgery.  Do not bring valuables to the hospital. [facility] is not responsible for any missing/lost belongings or valuables.   Notify your doctor if there is any change in your medical condition (cold, fever, infection).  Wear comfortable clothing (specific to your surgery type) to the hospital.  After surgery, you can help prevent lung complications by doing breathing exercises.  Take deep breaths and cough every 1-2 hours. Your doctor may order a device called an Incentive Spirometer to help you  take deep breaths. When coughing or sneezing, hold a pillow firmly against your incision with both hands. This is called "splinting." Doing this helps protect your incision. It also decreases belly discomfort.  If you are being admitted to the hospital overnight, leave your suitcase in the car. After surgery it may be brought to your room.  If you are being discharged the day of surgery, you will not be allowed to drive  home. You will need a responsible adult (18 years or older) to drive you home and stay with you that night.   If you are taking public transportation, you will need to have a responsible adult (18 years or older) with you. Please confirm with your physician that it is acceptable to use public transportation.   Please call the [facility] at [phone] if you have any questions about these instructions.  Surgery Visitation Policy:  Patients undergoing a surgery or procedure may have two family members or support persons with them as long as the person is not COVID-19 positive or experiencing its symptoms.   Inpatient Visitation:    Visiting hours are 7 a.m. to 8 p.m. Up to four visitors are allowed at one time in a patient room, including children. The visitors may rotate out with other people during the day. One designated support person (adult) may remain overnight.

## 2022-04-21 MED ORDER — FENTANYL CITRATE (PF) 100 MCG/2ML IJ SOLN
INTRAMUSCULAR | Status: AC
  Administered 2022-04-22: 50 ug via INTRAVENOUS
  Filled 2022-04-21: qty 2

## 2022-04-21 MED ORDER — CHLORHEXIDINE GLUCONATE 0.12 % MT SOLN: 15.0000 mL | Freq: Once | OROMUCOSAL | Status: AC

## 2022-04-21 MED ORDER — CEFAZOLIN SODIUM-DEXTROSE 2-4 GM/100ML-% IV SOLN
2.0000 g | Freq: Once | INTRAVENOUS | Status: AC
  Administered 2022-04-22: 2 g via INTRAVENOUS

## 2022-04-21 MED ORDER — CEFAZOLIN SODIUM-DEXTROSE 2-4 GM/100ML-% IV SOLN
INTRAVENOUS | Status: AC
  Filled 2022-04-21: qty 100

## 2022-04-21 MED ORDER — ORAL CARE MOUTH RINSE: 15.0000 mL | Freq: Once | OROMUCOSAL | Status: AC

## 2022-04-21 MED ORDER — FAMOTIDINE 20 MG PO TABS: 20.0000 mg | ORAL_TABLET | Freq: Once | ORAL | Status: AC

## 2022-04-21 MED ORDER — SODIUM CHLORIDE 0.9 % IV SOLN: INTRAVENOUS | Status: DC

## 2022-04-21 MED ORDER — ACETAMINOPHEN 500 MG PO TABS: 1000.0000 mg | ORAL_TABLET | ORAL | Status: AC

## 2022-04-21 MED ORDER — OXYCODONE HCL 5 MG PO TABS
ORAL_TABLET | ORAL | Status: AC
  Filled 2022-04-21: qty 1

## 2022-04-21 MED ORDER — LACTATED RINGERS IV SOLN: INTRAVENOUS | Status: DC

## 2022-04-21 MED ORDER — GABAPENTIN 300 MG PO CAPS: 300.0000 mg | ORAL_CAPSULE | ORAL | Status: AC

## 2022-04-21 MED ORDER — POVIDONE-IODINE 10 % EX SWAB
2.0000 | Freq: Once | CUTANEOUS | Status: AC
  Administered 2022-04-22: 2 via TOPICAL

## 2022-04-22 ENCOUNTER — Encounter: Payer: Self-pay | Admitting: Obstetrics and Gynecology

## 2022-04-22 ENCOUNTER — Ambulatory Visit: Payer: Medicaid Other | Admitting: Anesthesiology

## 2022-04-22 ENCOUNTER — Encounter
Admission: RE | Disposition: A | Discharge status: Home / Self Care | Payer: Self-pay | Source: Home / Self Care | Attending: Obstetrics and Gynecology

## 2022-04-22 ENCOUNTER — Other Ambulatory Visit: Payer: Medicaid Other

## 2022-04-22 ENCOUNTER — Other Ambulatory Visit: Payer: Self-pay

## 2022-04-22 ENCOUNTER — Ambulatory Visit
Admission: RE | Admit: 2022-04-22 | Discharge: 2022-04-22 | Disposition: A | Discharge status: Home / Self Care | Payer: Medicaid Other | Attending: Obstetrics and Gynecology | Admitting: Obstetrics and Gynecology

## 2022-04-22 DIAGNOSIS — E282 Polycystic ovarian syndrome: Secondary | ICD-10-CM | POA: Diagnosis not present

## 2022-04-22 DIAGNOSIS — N803 Endometriosis of pelvic peritoneum, unspecified: Secondary | ICD-10-CM | POA: Diagnosis not present

## 2022-04-22 DIAGNOSIS — F17210 Nicotine dependence, cigarettes, uncomplicated: Secondary | ICD-10-CM | POA: Diagnosis not present

## 2022-04-22 DIAGNOSIS — Z01818 Encounter for other preprocedural examination: Secondary | ICD-10-CM

## 2022-04-22 DIAGNOSIS — N838 Other noninflammatory disorders of ovary, fallopian tube and broad ligament: Secondary | ICD-10-CM | POA: Diagnosis not present

## 2022-04-22 DIAGNOSIS — D069 Carcinoma in situ of cervix, unspecified: Secondary | ICD-10-CM | POA: Diagnosis present

## 2022-04-22 DIAGNOSIS — Z7985 Long-term (current) use of injectable non-insulin antidiabetic drugs: Secondary | ICD-10-CM | POA: Diagnosis not present

## 2022-04-22 DIAGNOSIS — Z01812 Encounter for preprocedural laboratory examination: Secondary | ICD-10-CM

## 2022-04-22 DIAGNOSIS — E119 Type 2 diabetes mellitus without complications: Secondary | ICD-10-CM | POA: Insufficient documentation

## 2022-04-22 DIAGNOSIS — N736 Female pelvic peritoneal adhesions (postinfective): Secondary | ICD-10-CM | POA: Diagnosis not present

## 2022-04-22 HISTORY — PX: LAPAROSCOPIC VAGINAL HYSTERECTOMY WITH SALPINGECTOMY: SHX6680

## 2022-04-22 LAB — TYPE AND SCREEN
ABO/RH(D): O NEG
Antibody Screen: NEGATIVE

## 2022-04-22 LAB — GLUCOSE, CAPILLARY
Glucose-Capillary: 109 mg/dL — ABNORMAL HIGH (ref 70–99)
Glucose-Capillary: 117 mg/dL — ABNORMAL HIGH (ref 70–99)

## 2022-04-22 LAB — POCT PREGNANCY, URINE: Preg Test, Ur: NEGATIVE

## 2022-04-22 SURGERY — HYSTERECTOMY, VAGINAL, LAPAROSCOPY-ASSISTED, WITH SALPINGECTOMY
Anesthesia: General | Laterality: Bilateral

## 2022-04-22 MED ORDER — CHLORHEXIDINE GLUCONATE 0.12 % MT SOLN
OROMUCOSAL | Status: AC
  Administered 2022-04-22: 15 mL via OROMUCOSAL
  Filled 2022-04-22: qty 15

## 2022-04-22 MED ORDER — HYDROMORPHONE HCL 1 MG/ML IJ SOLN
INTRAMUSCULAR | Status: DC | PRN
  Administered 2022-04-22: 1 mg via INTRAVENOUS

## 2022-04-22 MED ORDER — FENTANYL CITRATE (PF) 100 MCG/2ML IJ SOLN
INTRAMUSCULAR | Status: AC
  Filled 2022-04-22: qty 2

## 2022-04-22 MED ORDER — ONDANSETRON HCL 4 MG/2ML IJ SOLN
INTRAMUSCULAR | Status: DC | PRN
  Administered 2022-04-22 (×2): 4 mg via INTRAVENOUS

## 2022-04-22 MED ORDER — ACETAMINOPHEN 500 MG PO TABS
ORAL_TABLET | ORAL | Status: AC
  Administered 2022-04-22: 1000 mg via ORAL
  Filled 2022-04-22: qty 2

## 2022-04-22 MED ORDER — OXYCODONE HCL 5 MG PO TABS: 5.0000 mg | ORAL_TABLET | Freq: Once | ORAL | Status: AC | PRN

## 2022-04-22 MED ORDER — MIDAZOLAM HCL 2 MG/2ML IJ SOLN
INTRAMUSCULAR | Status: DC | PRN
  Administered 2022-04-22: 2 mg via INTRAVENOUS

## 2022-04-22 MED ORDER — SUGAMMADEX SODIUM 500 MG/5ML IV SOLN
INTRAVENOUS | Status: DC | PRN
  Administered 2022-04-22: 500 mg via INTRAVENOUS

## 2022-04-22 MED ORDER — DEXMEDETOMIDINE HCL IN NACL 200 MCG/50ML IV SOLN
INTRAVENOUS | Status: DC | PRN
  Administered 2022-04-22: 8 ug via INTRAVENOUS

## 2022-04-22 MED ORDER — GABAPENTIN 300 MG PO CAPS
ORAL_CAPSULE | ORAL | Status: AC
  Administered 2022-04-22: 300 mg via ORAL
  Filled 2022-04-22: qty 1

## 2022-04-22 MED ORDER — BUPIVACAINE HCL 0.5 % IJ SOLN
INTRAMUSCULAR | Status: DC | PRN
  Administered 2022-04-22: 13 mL

## 2022-04-22 MED ORDER — MIDAZOLAM HCL 2 MG/2ML IJ SOLN
INTRAMUSCULAR | Status: AC
  Filled 2022-04-22: qty 2

## 2022-04-22 MED ORDER — OXYCODONE HCL 5 MG/5ML PO SOLN: 5.0000 mg | Freq: Once | ORAL | Status: AC | PRN

## 2022-04-22 MED ORDER — FENTANYL CITRATE (PF) 100 MCG/2ML IJ SOLN
INTRAMUSCULAR | Status: AC
  Administered 2022-04-22: 50 ug via INTRAVENOUS
  Filled 2022-04-22: qty 2

## 2022-04-22 MED ORDER — LIDOCAINE-EPINEPHRINE 1 %-1:100000 IJ SOLN
INTRAMUSCULAR | Status: DC | PRN
  Administered 2022-04-22: 10 mL

## 2022-04-22 MED ORDER — GLYCOPYRROLATE 0.2 MG/ML IJ SOLN
INTRAMUSCULAR | Status: DC | PRN
  Administered 2022-04-22: .2 mg via INTRAVENOUS

## 2022-04-22 MED ORDER — ONDANSETRON HCL 4 MG/2ML IJ SOLN: 4.0000 mg | Freq: Once | INTRAMUSCULAR | Status: DC | PRN

## 2022-04-22 MED ORDER — KETAMINE HCL 50 MG/5ML IJ SOSY
PREFILLED_SYRINGE | INTRAMUSCULAR | Status: AC
  Filled 2022-04-22: qty 5

## 2022-04-22 MED ORDER — FAMOTIDINE 20 MG PO TABS
ORAL_TABLET | ORAL | Status: AC
  Administered 2022-04-22: 20 mg via ORAL
  Filled 2022-04-22: qty 1

## 2022-04-22 MED ORDER — FENTANYL CITRATE (PF) 100 MCG/2ML IJ SOLN
INTRAMUSCULAR | Status: DC | PRN
  Administered 2022-04-22: 50 ug via INTRAVENOUS

## 2022-04-22 MED ORDER — PHENYLEPHRINE HCL-NACL 20-0.9 MG/250ML-% IV SOLN
INTRAVENOUS | Status: AC
  Filled 2022-04-22: qty 250

## 2022-04-22 MED ORDER — HYDROMORPHONE HCL 1 MG/ML IJ SOLN
INTRAMUSCULAR | Status: AC
  Filled 2022-04-22: qty 1

## 2022-04-22 MED ORDER — PHENYLEPHRINE 80 MCG/ML (10ML) SYRINGE FOR IV PUSH (FOR BLOOD PRESSURE SUPPORT)
PREFILLED_SYRINGE | INTRAVENOUS | Status: DC | PRN
  Administered 2022-04-22: 160 ug via INTRAVENOUS

## 2022-04-22 MED ORDER — HEMOSTATIC AGENTS (NO CHARGE) OPTIME
TOPICAL | Status: DC | PRN
  Administered 2022-04-22: 1 via TOPICAL

## 2022-04-22 MED ORDER — ACETAMINOPHEN 10 MG/ML IV SOLN
INTRAVENOUS | Status: AC
  Filled 2022-04-22: qty 100

## 2022-04-22 MED ORDER — SODIUM CHLORIDE 0.9 % IR SOLN
Status: DC | PRN
  Administered 2022-04-22: 500 mL

## 2022-04-22 MED ORDER — PROPOFOL 10 MG/ML IV BOLUS
INTRAVENOUS | Status: DC | PRN
  Administered 2022-04-22: 200 mg via INTRAVENOUS

## 2022-04-22 MED ORDER — ACETAMINOPHEN 10 MG/ML IV SOLN
1000.0000 mg | Freq: Once | INTRAVENOUS | Status: DC | PRN
  Administered 2022-04-22: 1000 mg via INTRAVENOUS

## 2022-04-22 MED ORDER — LACTATED RINGERS IV SOLN: INTRAVENOUS | Status: DC

## 2022-04-22 MED ORDER — ROCURONIUM BROMIDE 100 MG/10ML IV SOLN
INTRAVENOUS | Status: DC | PRN
  Administered 2022-04-22: 70 mg via INTRAVENOUS
  Administered 2022-04-22: 30 mg via INTRAVENOUS

## 2022-04-22 MED ORDER — LIDOCAINE-EPINEPHRINE 1 %-1:100000 IJ SOLN
INTRAMUSCULAR | Status: AC
  Filled 2022-04-22: qty 1

## 2022-04-22 MED ORDER — DEXAMETHASONE SODIUM PHOSPHATE 10 MG/ML IJ SOLN
INTRAMUSCULAR | Status: DC | PRN
  Administered 2022-04-22: 4 mg via INTRAVENOUS

## 2022-04-22 MED ORDER — OXYCODONE-ACETAMINOPHEN 5-325 MG PO TABS: 1.0000 | ORAL_TABLET | ORAL | Status: DC | PRN

## 2022-04-22 MED ORDER — OXYCODONE HCL 5 MG PO TABS
ORAL_TABLET | ORAL | Status: AC
  Administered 2022-04-22: 5 mg via ORAL
  Filled 2022-04-22: qty 1

## 2022-04-22 MED ORDER — KETAMINE HCL 10 MG/ML IJ SOLN
INTRAMUSCULAR | Status: DC | PRN
  Administered 2022-04-22: 30 mg via INTRAVENOUS
  Administered 2022-04-22: 20 mg via INTRAVENOUS

## 2022-04-22 MED ORDER — PHENYLEPHRINE HCL-NACL 20-0.9 MG/250ML-% IV SOLN
INTRAVENOUS | Status: DC | PRN
  Administered 2022-04-22: 25 ug/min via INTRAVENOUS

## 2022-04-22 MED ORDER — LIDOCAINE HCL (CARDIAC) PF 100 MG/5ML IV SOSY
PREFILLED_SYRINGE | INTRAVENOUS | Status: DC | PRN
  Administered 2022-04-22: 100 mg via INTRAVENOUS

## 2022-04-22 MED ORDER — FENTANYL CITRATE (PF) 100 MCG/2ML IJ SOLN
25.0000 ug | INTRAMUSCULAR | Status: DC | PRN
  Administered 2022-04-22: 50 ug via INTRAVENOUS

## 2022-04-22 SURGICAL SUPPLY — 51 items
APL PRP STRL LF DISP 70% ISPRP (MISCELLANEOUS) ×1
APL SRG 38 LTWT LNG FL B (MISCELLANEOUS) ×1
APPLICATOR ARISTA FLEXITIP XL (MISCELLANEOUS) IMPLANT
BAG DRN RND TRDRP ANRFLXCHMBR (UROLOGICAL SUPPLIES) ×1
BAG URINE DRAIN 2000ML AR STRL (UROLOGICAL SUPPLIES) ×1 IMPLANT
BLADE SURG SZ11 CARB STEEL (BLADE) ×1 IMPLANT
CATH FOLEY 2WAY  5CC 16FR (CATHETERS) ×1
CATH FOLEY 2WAY 5CC 16FR (CATHETERS) ×1
CATH ROBINSON RED A/P 16FR (CATHETERS) ×1 IMPLANT
CATH URTH 16FR FL 2W BLN LF (CATHETERS) ×1 IMPLANT
CHLORAPREP W/TINT 26 (MISCELLANEOUS) ×1 IMPLANT
DRAPE SURG 17X11 SM STRL (DRAPES) ×1 IMPLANT
DRSG TEGADERM 2-3/8X2-3/4 SM (GAUZE/BANDAGES/DRESSINGS) ×4 IMPLANT
ELECT REM PT RETURN 9FT ADLT (ELECTROSURGICAL) ×1
ELECTRODE REM PT RTRN 9FT ADLT (ELECTROSURGICAL) ×1 IMPLANT
GAUZE 4X4 16PLY ~~LOC~~+RFID DBL (SPONGE) ×3 IMPLANT
GLOVE SURG SYN 8.0 (GLOVE) ×3 IMPLANT
GLOVE SURG SYN 8.0 PF PI (GLOVE) ×3 IMPLANT
GOWN STRL REUS W/ TWL LRG LVL3 (GOWN DISPOSABLE) ×2 IMPLANT
GOWN STRL REUS W/ TWL XL LVL3 (GOWN DISPOSABLE) ×3 IMPLANT
GOWN STRL REUS W/TWL LRG LVL3 (GOWN DISPOSABLE) ×5
GOWN STRL REUS W/TWL XL LVL3 (GOWN DISPOSABLE) ×3
HEMOSTAT ARISTA ABSORB 3G PWDR (HEMOSTASIS) IMPLANT
IRRIGATION STRYKERFLOW (MISCELLANEOUS) ×1 IMPLANT
IRRIGATOR STRYKERFLOW (MISCELLANEOUS) ×1
IV LACTATED RINGERS 1000ML (IV SOLUTION) ×1 IMPLANT
KIT PINK PAD W/HEAD ARE REST (MISCELLANEOUS) ×1
KIT PINK PAD W/HEAD ARM REST (MISCELLANEOUS) ×1 IMPLANT
KIT TURNOVER CYSTO (KITS) ×1 IMPLANT
LABEL OR SOLS (LABEL) ×1 IMPLANT
MANIFOLD NEPTUNE II (INSTRUMENTS) ×1 IMPLANT
NEEDLE HYPO 22GX1.5 SAFETY (NEEDLE) ×1 IMPLANT
PACK BASIN MINOR ARMC (MISCELLANEOUS) ×1 IMPLANT
PACK GYN LAPAROSCOPIC (MISCELLANEOUS) ×1 IMPLANT
PAD OB MATERNITY 4.3X12.25 (PERSONAL CARE ITEMS) ×1 IMPLANT
SCRUB CHG 4% DYNA-HEX 4OZ (MISCELLANEOUS) ×1 IMPLANT
SHEARS HARMONIC ACE PLUS 36CM (ENDOMECHANICALS) IMPLANT
SLEEVE Z-THREAD 5X100MM (TROCAR) ×2 IMPLANT
SPONGE GAUZE 2X2 8PLY STRL LF (GAUZE/BANDAGES/DRESSINGS) ×3 IMPLANT
SUT VIC AB 0 CT1 27 (SUTURE) ×2
SUT VIC AB 0 CT1 27XCR 8 STRN (SUTURE) ×2 IMPLANT
SUT VIC AB 0 CT1 36 (SUTURE) ×1 IMPLANT
SUT VIC AB 2-0 SH 27 (SUTURE) ×1
SUT VIC AB 2-0 SH 27XBRD (SUTURE) ×1 IMPLANT
SUT VIC AB 4-0 SH 27 (SUTURE) ×2
SUT VIC AB 4-0 SH 27XANBCTRL (SUTURE) ×1 IMPLANT
SYR 10ML LL (SYRINGE) ×1 IMPLANT
SYR CONTROL 10ML LL (SYRINGE) ×1 IMPLANT
TRAP FLUID SMOKE EVACUATOR (MISCELLANEOUS) ×1 IMPLANT
TROCAR XCEL NON-BLD 5MMX100MML (ENDOMECHANICALS) ×1 IMPLANT
TUBING EVAC SMOKE HEATED PNEUM (TUBING) ×1 IMPLANT

## 2022-04-22 NOTE — Anesthesia Procedure Notes (Signed)
Procedure Name: Intubation Date/Time: 04/22/2022 11:05 AM  Performed by: Kelton Pillar, CRNAPre-anesthesia Checklist: Patient identified, Emergency Drugs available, Suction available and Patient being monitored Patient Re-evaluated:Patient Re-evaluated prior to induction Oxygen Delivery Method: Circle system utilized Preoxygenation: Pre-oxygenation with 100% oxygen Induction Type: IV induction Ventilation: Mask ventilation without difficulty Laryngoscope Size: McGraph and 3 Grade View: Grade I Tube type: Oral Tube size: 7.0 mm Number of attempts: 1 Airway Equipment and Method: Stylet and Oral airway Placement Confirmation: ETT inserted through vocal cords under direct vision, positive ETCO2, breath sounds checked- equal and bilateral and CO2 detector Secured at: 21 cm Tube secured with: Tape Dental Injury: Teeth and Oropharynx as per pre-operative assessment

## 2022-04-22 NOTE — Anesthesia Postprocedure Evaluation (Signed)
Anesthesia Post Note  Patient: Kim Crawford  Procedure(s) Performed: LAPAROSCOPIC ASSISTED VAGINAL HYSTERECTOMY WITH SALPINGECTOMY (Bilateral)  Patient location during evaluation: PACU Anesthesia Type: General Level of consciousness: awake and alert, oriented and patient cooperative Pain management: pain level controlled Vital Signs Assessment: post-procedure vital signs reviewed and stable Respiratory status: spontaneous breathing, nonlabored ventilation and respiratory function stable Cardiovascular status: blood pressure returned to baseline and stable Postop Assessment: adequate PO intake Anesthetic complications: no   No notable events documented.   Last Vitals:  Vitals:   04/22/22 1345 04/22/22 1400  BP: 105/62 99/64  Pulse: 77 72  Resp: 17 17  Temp:  36.7 C  SpO2: 95% 95%    Last Pain:  Vitals:   04/22/22 1400  TempSrc:   PainSc: Asleep                 Darrin Nipper

## 2022-04-22 NOTE — Op Note (Unsigned)
NAME: Kim Crawford, Kim Crawford MEDICAL RECORD NO: [id number] ACCOUNT NO: [id number] DATE OF BIRTH: [birth year] FACILITY: [facility] LOCATION: [facility] PHYSICIAN: Boykin Nearing, MD  Operative Report   DATE OF PROCEDURE: 04/22/2022  PREOPERATIVE DIAGNOSIS:  Severe cervical dysplasia, grade 2-3.  POSTOPERATIVE DIAGNOSES:   1.  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 2.  Pelvic endometriosis. 3.  Pelvic adhesions.  PROCEDURE:   1.  Laparoscopic-assisted vaginal hysterectomy. 2.  Bilateral salpingectomy. 3.  Lysis of adhesions. 4.  Excision of left pelvic sidewall endometriosis.  SURGEON:  Boykin Nearing, MD  FIRST ASSISTANT:  Donneta Romberg, MD  SECOND ASSISTANT:  PA student [place] Sink.  ANESTHESIA:  General endotracheal anesthesia.  INDICATIONS:  A 31 year old female with severe cervical dysplasia.  The patient has elected for definitive surgical intervention.  DESCRIPTION OF PROCEDURE:  After adequate general endotracheal anesthesia, the patient was placed in dorsal supine position with the legs in the [place] stirrups.  The patient's vagina, abdomen and perineum were all prepped and draped in normal sterile  fashion.  Timeout was performed.  The patient did receive 2 grams of IV Ancef for surgical prophylaxis.  A weighted speculum was placed in the vagina and the anterior cervix was grasped with single tooth tenaculum and a Cohen endocervical manipulator was  placed.  A Foley catheter was placed, yielding a 150 mL clear urine.  Gloves and gown were changed.  Attention was directed to the patient's abdomen where a 5 mm infraumbilical incision was made after injecting with 0.5% Marcaine.  The 5 mm laparoscope  was advanced into the abdominal cavity under direct visualization.  The patient's abdomen was insufflated.  Second port placement was placed left lower quadrant, 3 cm medial to the left anterior iliac spine under direct visualization.  Third port was  placed in the right  lower quadrant, again 3 cm medial to the right anterior iliac spine, a 5 mm trocar was advanced.  Initial impression of the abdomen revealed omental adhesions to the mid abdomen.  Attention was directed to the patient's posterior  cul-de-sac where each ureter was identified with normal peristaltic activity.  The left fallopian tube was grasped on its distal portion and the Harmonic scalpel was brought up and dissected the mesosalpinx to the origin of the uterus.  The uteroovarian  ligament was then cauterized followed by transecting the left round ligament and broad ligament and skeletization of the left uterine artery.  The Kleppinger was used to cauterize the uterine artery and then transect it.  Anterior bladder flap was  created and dissected down.  There was a dense adhesions that ultimately were removed with the Harmonic scalpel.  Similar procedure was repeated on the patient's right side after dissecting and transecting the right mesosalpinx, the right uteroovarian  ligament was transected, followed by the right round ligament and broad ligament with also skeletization of the right uterine artery.  Cautery was used before transecting the artery with the Harmonic scalpel.  At this point, several white patches were  noted on the left pelvic sidewall, which looked consistent with endometriosis.  One of these areas was grasped with the laparoscopic grasper and a biopsy and excision of this area was performed.  This looks consistent with endometriosis.  Attention was  then directed vaginally where the Foley catheter was removed.  A weighted speculum was placed in the posterior vaginal vault and the cervix was grasped with 2 thyroid tenacula after injecting with 1% lidocaine with 1:100,000 epinephrine.  The posterior  cul-de-sac incision  was made gained entry into the posterior cul-de-sac without difficulty.  The peritoneal edge was then attached to the vaginal epithelium.  Long billed weighted speculum was  placed.  The uterosacral ligaments were then bilaterally  clamped, transected and tagged for later identification.  The anterior cervix was circumferentially incised with the Bovie and the anterior cul-de-sac was then entered bluntly without difficulty and a Deaver retractor was placed within to reflect the  bladder anteriorly.  Cardinal ligaments were then bilaterally clamped, transected and suture ligated with 0 Vicryl.  One additional clamping on each side with suturing allowed for delivery of the cervix, uterus and fallopian tubes.  Good hemostasis was  noted.  The vaginal cuff was then closed with 0 Vicryl suture in a running nonlocking fashion, good approximation of edges.  The uterosacral ligaments were plicated centrally and the rest of the cuff was closed with the 0 Vicryl suture.  Again and  straight catheterization yielded additional 50 mL clear urine.  Gloves and gown were changed again and attention was directed to the patient's abdomen, which patient's abdomen was reinsufflated.  There was good hemostasis noted.  A small amount of oozing  was noted on the right uterosacral ligament area, which was controlled with bipolar cautery.  The patient's abdomen was irrigated.  Good hemostasis noted.  Pressure was lowered to 7 mmHg and again good hemostasis noted.  Arista was placed at the base of  the vaginal cuff.  Intraoperative pictures were taken of the procedure and the patient's abdomen was then deflated and each port site incision was closed with 4-0 Vicryl suture.  Good cosmetic effect.  Sterile dressing applied.  There were no complications.  ESTIMATED BLOOD LOSS:  50 mL.  INTRAOPERATIVE FLUIDS:  1000 mL.  URINE OUTPUT:  200 mL.  The patient was taken to recovery room in good condition.   PUS D: 04/22/2022 1:54:32 pm T: 04/22/2022 6:36:00 pm  JOB: [id number]/ [id number]

## 2022-04-22 NOTE — Brief Op Note (Signed)
04/22/2022  1:22 PM  PATIENT:  Kim Crawford  31 y.o. female  PRE-OPERATIVE DIAGNOSIS:  cervical dysplasia II-III  POST-OPERATIVE DIAGNOSIS:  cervical dysplasia II-III  PROCEDURE:  Procedure(s): LAPAROSCOPIC ASSISTED VAGINAL HYSTERECTOMY WITH SALPINGECTOMY (Bilateral) Excision of pelvic endometriosis SURGEON:  Surgeon(s) and Role:    * Rylen Swindler, Gwen Her, MD - Primary    * Will Bonnet, MD - Assisting  PHYSICIAN ASSISTANT: PA student Clara [place] Sink   ASSISTANTS: none   ANESTHESIA:   general  EBL:  50 mL  IOF 100 cc UO 200 cc  BLOOD ADMINISTERED:none  DRAINS: none   LOCAL MEDICATIONS USED:  LIDOCAINE   SPECIMEN:  Source of Specimen:  cx , uterus , fallopian tube and excision of left side wall endometriosis  DISPOSITION OF SPECIMEN:  PATHOLOGY  COUNTS:  YES  TOURNIQUET:  * No tourniquets in log *  DICTATION: .Other Dictation: Dictation Number verbal  PLAN OF CARE: Discharge to home after PACU  PATIENT DISPOSITION:  PACU - hemodynamically stable.   Delay start of Pharmacological VTE agent (>24hrs) due to surgical blood loss or risk of bleeding: not applicable

## 2022-04-22 NOTE — Anesthesia Preprocedure Evaluation (Addendum)
Anesthesia Evaluation  Patient identified by MRN, date of birth, ID band Patient awake    Reviewed: Allergy & Precautions, NPO status , Patient's Chart, lab work & pertinent test results  History of Anesthesia Complications Negative for: history of anesthetic complications  Airway Mallampati: I   Neck ROM: Full    Dental  (+) Missing   Pulmonary Current Smoker (4 cigarettes per day)Patient did not abstain from smoking.   Pulmonary exam normal breath sounds clear to auscultation       Cardiovascular hypertension, Normal cardiovascular exam Rhythm:Regular Rate:Normal     Neuro/Psych  Headaches    GI/Hepatic Last Ozempic dose 04/12/22   Endo/Other  diabetes, Type 2  PCOS  Renal/GU negative Renal ROS     Musculoskeletal   Abdominal   Peds  Hematology negative hematology ROS (+)   Anesthesia Other Findings   Reproductive/Obstetrics                             Anesthesia Physical Anesthesia Plan  ASA: 2  Anesthesia Plan: General   Post-op Pain Management:    Induction: Intravenous  PONV Risk Score and Plan: 2 and Ondansetron, Dexamethasone and Treatment may vary due to age or medical condition  Airway Management Planned: Oral ETT  Additional Equipment:   Intra-op Plan:   Post-operative Plan: Extubation in OR  Informed Consent: I have reviewed the patients History and Physical, chart, labs and discussed the procedure including the risks, benefits and alternatives for the proposed anesthesia with the patient or authorized representative who has indicated his/her understanding and acceptance.     Dental advisory given  Plan Discussed with: CRNA  Anesthesia Plan Comments: (Patient consented for risks of anesthesia including but not limited to:  - adverse reactions to medications - damage to eyes, teeth, lips or other oral mucosa - nerve damage due to positioning  - sore  throat or hoarseness - damage to heart, brain, nerves, lungs, other parts of body or loss of life  Informed patient about role of CRNA in peri- and intra-operative care.  Patient voiced understanding.)       Anesthesia Quick Evaluation

## 2022-04-22 NOTE — Progress Notes (Signed)
Pt ready for LAVH . BS . Labs reviewed . All questions answered . Proceed

## 2022-04-22 NOTE — Discharge Instructions (Signed)

## 2022-04-22 NOTE — Transfer of Care (Signed)
Immediate Anesthesia Transfer of Care Note  Patient: Kim Crawford  Procedure(s) Performed: LAPAROSCOPIC ASSISTED VAGINAL HYSTERECTOMY WITH SALPINGECTOMY (Bilateral)  Patient Location: PACU  Anesthesia Type:General  Level of Consciousness: awake, drowsy, and patient cooperative  Airway & Oxygen Therapy: Patient Spontanous Breathing and Patient connected to face mask oxygen  Post-op Assessment: Report given to RN and Post -op Vital signs reviewed and stable  Post vital signs: Reviewed and stable  Last Vitals:  Vitals Value Taken Time  BP 111/76 04/22/22 1336  Temp 36.3 C 04/22/22 1336  Pulse 76 04/22/22 1339  Resp 15 04/22/22 1339  SpO2 94 % 04/22/22 1339  Vitals shown include unvalidated device data.  Last Pain:  Vitals:   04/22/22 1336  TempSrc:   PainSc: Asleep         Complications: No notable events documented.

## 2022-04-23 ENCOUNTER — Encounter: Payer: Self-pay | Admitting: Obstetrics and Gynecology

## 2022-04-26 LAB — SURGICAL PATHOLOGY

## 2022-06-14 ENCOUNTER — Encounter: Payer: Self-pay | Admitting: Internal Medicine

## 2022-06-14 ENCOUNTER — Ambulatory Visit (INDEPENDENT_AMBULATORY_CARE_PROVIDER_SITE_OTHER): Payer: Medicaid Other | Admitting: Internal Medicine

## 2022-06-14 VITALS — BP 124/82 | HR 76 | Ht 65.0 in | Wt 190.0 lb

## 2022-06-14 DIAGNOSIS — E119 Type 2 diabetes mellitus without complications: Secondary | ICD-10-CM

## 2022-06-14 DIAGNOSIS — E1165 Type 2 diabetes mellitus with hyperglycemia: Secondary | ICD-10-CM

## 2022-06-14 LAB — POCT GLUCOSE (DEVICE FOR HOME USE): POC Glucose: 197 mg/dl — AB (ref 70–99)

## 2022-06-14 LAB — POCT GLYCOSYLATED HEMOGLOBIN (HGB A1C): Hemoglobin A1C: 7 % — AB (ref 4.0–5.6)

## 2022-06-14 MED ORDER — METFORMIN HCL ER 500 MG PO TB24: 500.0000 mg | ORAL_TABLET | Freq: Two times a day (BID) | ORAL | 3 refills | Status: DC

## 2022-06-14 MED ORDER — FREESTYLE LIBRE 3 SENSOR MISC: 1.0000 | 11 refills | Status: AC

## 2022-06-14 NOTE — Progress Notes (Signed)
Name: Kim Crawford  MRN/ DOB: [id number], 1990/12/26   Age/ Sex: 32 y.o., female    PCP: Idelle Crouch, MD   Reason for Endocrinology Evaluation: Type 2 Diabetes Mellitus     Date of Initial Endocrinology Visit: 02/09/2022    PATIENT IDENTIFIER: Kim Crawford is a 32 y.o. female with a past medical history of T2DM, dyslipidemia, HTN. The patient presented for initial endocrinology clinic visit on 02/09/2022 for consultative assistance with her diabetes management.    HPI: Kim Crawford  is accompanied by her mother    Diagnosed with DM 2020 Prior Medications tried/Intolerance: Invokana -recurrent  yeast infection                Hemoglobin A1c has ranged from 6.8 % in 2021, peaking at 7.6% in 2023  On her initial visit to our clinic she had an A1c of 7.3%, her Ozempic had recently increased to 1 mg which we continued as well as continuing metformin     SUBJECTIVE:   During the last visit (02/09/2022): A1c 7.3%  Today (06/14/22): Kim Crawford is here for follow-up on diabetes management.  She has not been using freestyle libre as she was out of a  job and this became cost prohibitive.  The patient has not had hypoglycemic episodes since the last clinic visit.    She has been noted with weight gain , stopped Ozempic due to gallbladder issues but she is also involved in a class law suit for Ozempic and unable to use it at this time  She is s/p hysterectomy 04/2022 as well as cholecystomy 03/2022  Tingling has been improving   HOME DIABETES REGIMEN: Metformin 500 mg BID Ozempic 1 mg weekly ( Tuesday) - not taking     Statin: no ACE-I/ARB: no    DIABETIC COMPLICATIONS: Microvascular complications:   Denies: CKD, retinopaty, neuropathy Last eye exam: Completed 2023  Macrovascular complications:   Denies: CAD, PVD, CVA   PAST HISTORY: Past Medical History:  Past Medical History:  Diagnosis Date   Breast mass, right 2014   Diabetes mellitus without  complication ([facility])    Eczema    Headache    migaines   Hypertension    PCOS (polycystic ovarian syndrome)    Past Surgical History:  Past Surgical History:  Procedure Laterality Date   BREAST SURGERY Right 06/13/2012   breast mass   CESAREAN SECTION  06/13/2009   CHOLECYSTECTOMY     COLONOSCOPY WITH PROPOFOL N/A 05/29/2019   Procedure: COLONOSCOPY WITH PROPOFOL;  Surgeon: Lin Landsman, MD;  Location: [facility];  Service: Gastroenterology;  Laterality: N/A;   LAPAROSCOPIC VAGINAL HYSTERECTOMY WITH SALPINGECTOMY Bilateral 04/22/2022   Procedure: LAPAROSCOPIC ASSISTED VAGINAL HYSTERECTOMY WITH SALPINGECTOMY;  Surgeon: Schermerhorn, Gwen Her, MD;  Location: [facility] OR[facility];  Service: Gynecology;  Laterality: Bilateral;   LEEP N/A 09/23/2021   Procedure: LOOP ELECTROSURGICAL EXCISION PROCEDURE (LEEP);  Surgeon: Schermerhorn, Gwen Her, MD;  Location: [facility] OR[facility];  Service: Gynecology;  Laterality: N/A;   PILONIDAL CYST EXCISION N/A 02/27/2018   Procedure: CYST EXCISION PILONIDAL SIMPLE;  Surgeon: Jules Husbands, MD;  Location: [facility] OR[facility];  Service: General;  Laterality: N/A;    Social History:  reports that she has been smoking cigarettes. She has a 6.00 pack-year smoking history. She has never used smokeless tobacco. She reports current alcohol use. She reports that she does not use drugs. Family History:  Family History  Adopted: Yes  Problem Relation Age of Onset   Hypertension Mother  Diabetes Mother    Parkinson's disease Father      HOME MEDICATIONS: Allergies as of 06/14/2022       Reactions   Toradol [ketorolac Tromethamine] Other (See Comments)   "Suicide headaches" says that she can take in lower doses.   Tramadol Other (See Comments)   Severe headaches   Hydrocodone Other (See Comments)   Severe migraine headaches (Can take in small doses), states that she is able to now tolerate   Penicillins Rash, Other (See Comments)   Has patient had a PCN reaction causing  immediate rash, facial/tongue/throat swelling, SOB or lightheadedness with hypotension: Unknown Has patient had a PCN reaction causing severe rash involving mucus membranes or skin necrosis: No Has patient had a PCN reaction that required hospitalization: No Has patient had a PCN reaction occurring within the last 10 years: No If all of the above answers are "NO", then may proceed with Cephalosporin use.        Medication List        Accurate as of June 14, 2022  9:52 AM. If you have any questions, ask your nurse or doctor.          blood glucose meter kit and supplies Kit Dispense based on patient and insurance preference. Use up to four times daily as directed. (FOR ICD-9 250.00, 250.01).   buPROPion 150 MG 24 hr tablet Commonly known as: WELLBUTRIN XL Take 150 mg by mouth daily.   FreeStyle Libre 14 Day Sensor Misc by Does not apply route.   gemfibrozil 600 MG tablet Commonly known as: LOPID Take 600 mg by mouth in the morning and at bedtime.   ibuprofen 800 MG tablet Commonly known as: ADVIL Take 1 tablet (800 mg total) by mouth every 8 (eight) hours as needed for mild pain or moderate pain.   metFORMIN 500 MG tablet Commonly known as: GLUCOPHAGE Take 500 mg by mouth in the morning and at bedtime.   oxyCODONE-acetaminophen 5-325 MG tablet Commonly known as: PERCOCET/ROXICET Take 1 tablet by mouth 4 (four) times daily as needed.   Ozempic (1 MG/DOSE) 4 MG/3ML Sopn Generic drug: Semaglutide (1 MG/DOSE) Inject 1 mg into the skin once a week.   propranolol 20 MG tablet Commonly known as: INDERAL Take 20 mg by mouth 2 (two) times daily.         ALLERGIES: Allergies  Allergen Reactions   Toradol [Ketorolac Tromethamine] Other (See Comments)    "Suicide headaches" says that she can take in lower doses.   Tramadol Other (See Comments)    Severe headaches   Hydrocodone Other (See Comments)    Severe migraine headaches (Can take in small doses), states  that she is able to now tolerate   Penicillins Rash and Other (See Comments)    Has patient had a PCN reaction causing immediate rash, facial/tongue/throat swelling, SOB or lightheadedness with hypotension: Unknown Has patient had a PCN reaction causing severe rash involving mucus membranes or skin necrosis: No Has patient had a PCN reaction that required hospitalization: No Has patient had a PCN reaction occurring within the last 10 years: No If all of the above answers are "NO", then may proceed with Cephalosporin use.      REVIEW OF SYSTEMS: A comprehensive ROS was conducted with the patient and is negative except as per HPI and below:  Review of Systems  Gastrointestinal:  Negative for diarrhea, nausea and vomiting.  Neurological:  Negative for tingling.      OBJECTIVE:  VITAL SIGNS: BP 124/82 (BP Location: Left Arm, Patient Position: Sitting, Cuff Size: Large)   Pulse 76   Ht _0  (1.651 m)   Wt 190 lb (86.2 kg)   SpO2 98%   BMI 31.62 kg/m    PHYSICAL EXAM:  General: Pt appears well and is in NAD  Neck: General: Supple without adenopathy or carotid bruits. Thyroid: Thyroid size normal.  No goiter or nodules appreciated.  Lungs: Clear with good BS bilat with no rales, rhonchi, or wheezes  Heart: RRR with normal S1 and S2 and no gallops; no murmurs; no rub  Abdomen: Normoactive bowel sounds, soft, nontender, without masses or organomegaly palpable  Extremities:  Lower extremities - No pretibial edema. No lesions.  Neuro: MS is good with appropriate affect, pt is alert and Ox3    DM foot exam: 02/09/2022  The skin of the feet is intact without sores or ulcerations. The pedal pulses are 2+ on right and 2+ on left. The sensation is intact to a screening 5.07, 10 gram monofilament bilaterally    DATA REVIEWED:  Lab Results  Component Value Date   HGBA1C 7.0 (A) 06/14/2022   HGBA1C 5.9 (H) 03/23/2022   HGBA1C 7.5 (H) 05/27/2019   Lab Results  Component Value  Date   CREATININE 0.58 03/25/2022   01/17/2022 BUN/Cr 13/0.6 GFR 115 Tg 130 LDL 84 HDL 28.7 A1c 7.3%      In office BG 197    ASSESSMENT / PLAN / RECOMMENDATIONS:   1) Type 2 Diabetes Mellitus, optimally controlled, Without complications - Most recent A1c of 7.0 %. Goal A1c < 7.0 %.    - A1c remains at goal but has increased  - She stopped Ozempic due to gallbladder issues requiring and currently in a class action lawsuit and can't go on it at this time  - She developed 2 yeast  infections while on Invokana - She was given the option of increasing metformin at this time since she is intolerant to SGLT-2 inhibitors and ozempic but she opted to work on lifestyle changes   - Will switch metformin to XR formulation for 24 hr coverage and less risk of side effects  - A prescription for freestyle libre3 sent   MEDICATIONS: Continue metformin 500 mg twice daily   EDUCATION / INSTRUCTIONS: BG monitoring instructions: Patient is instructed to check her blood sugars 3 times a day, before meals .   2) Diabetic complications:  Eye: Does not have known diabetic retinopathy.  Neuro/ Feet: Does not have known diabetic peripheral neuropathy. Renal: Patient does not have known baseline CKD. She is not on an ACEI/ARB at present.  3) Hypertriglyceridemia    - She had an elevated Tg as high as 839 mg/dL in 08/2019 , that's when she was started on Gemfibrozil  - Repeat Tg and LDL normalized  --We had  discuss in the past that by the  age of 16 and based on ADA recommendations she will need to be on statin therapy due to cardiovascular benefits    F/U in 4 months    Signed electronically by: Mack Guise, MD  [facility] Endocrinology  [facility] [address] [place], [place] [address] Phone: [phone] FAX: [phone]   CC: Idelle Crouch, MD [facility] [place] [address] Phone: [phone]  Fax:  [phone]    Return to Endocrinology clinic as below: No future appointments.

## 2022-06-14 NOTE — Patient Instructions (Signed)
Continue Metformin 500 mg twice daily     HOW TO TREAT LOW BLOOD SUGARS (Blood sugar LESS THAN 70 MG/DL) Please follow the RULE OF 15 for the treatment of hypoglycemia treatment (when your (blood sugars are less than 70 mg/dL)   STEP 1: Take 15 grams of carbohydrates when your blood sugar is low, which includes:  3-4 GLUCOSE TABS  OR 3-4 OZ OF JUICE OR REGULAR SODA OR ONE TUBE OF GLUCOSE GEL    STEP 2: RECHECK blood sugar in 15 MINUTES STEP 3: If your blood sugar is still low at the 15 minute recheck --> then, go back to STEP 1 and treat AGAIN with another 15 grams of carbohydrates.

## 2022-10-24 ENCOUNTER — Ambulatory Visit (INDEPENDENT_AMBULATORY_CARE_PROVIDER_SITE_OTHER): Payer: Medicaid Other | Admitting: Internal Medicine

## 2022-10-24 ENCOUNTER — Encounter: Payer: Self-pay | Admitting: Internal Medicine

## 2022-10-24 VITALS — BP 134/80 | HR 82 | Ht 65.0 in | Wt 189.0 lb

## 2022-10-24 DIAGNOSIS — E1165 Type 2 diabetes mellitus with hyperglycemia: Secondary | ICD-10-CM | POA: Diagnosis not present

## 2022-10-24 DIAGNOSIS — E781 Pure hyperglyceridemia: Secondary | ICD-10-CM

## 2022-10-24 DIAGNOSIS — Z7984 Long term (current) use of oral hypoglycemic drugs: Secondary | ICD-10-CM

## 2022-10-24 MED ORDER — ACCU-CHEK GUIDE VI STRP: 1.0000 | ORAL_STRIP | Freq: Every day | 3 refills | Status: AC

## 2022-10-24 MED ORDER — METFORMIN HCL ER 500 MG PO TB24: 500.0000 mg | ORAL_TABLET | Freq: Two times a day (BID) | ORAL | 3 refills | Status: AC

## 2022-10-24 NOTE — Patient Instructions (Signed)
Continue Metformin 500 mg twice daily     HOW TO TREAT LOW BLOOD SUGARS (Blood sugar LESS THAN 70 MG/DL) Please follow the RULE OF 15 for the treatment of hypoglycemia treatment (when your (blood sugars are less than 70 mg/dL)   STEP 1: Take 15 grams of carbohydrates when your blood sugar is low, which includes:  3-4 GLUCOSE TABS  OR 3-4 OZ OF JUICE OR REGULAR SODA OR ONE TUBE OF GLUCOSE GEL    STEP 2: RECHECK blood sugar in 15 MINUTES STEP 3: If your blood sugar is still low at the 15 minute recheck --> then, go back to STEP 1 and treat AGAIN with another 15 grams of carbohydrates. 

## 2022-10-24 NOTE — Progress Notes (Signed)
Name: Kim Crawford  MRN/ DOB: [id number], 01/15/1991   Age/ Sex: 32 y.o., female    PCP: Marguarite Arbour, MD   Reason for Endocrinology Evaluation: Type 2 Diabetes Mellitus     Date of Initial Endocrinology Visit: 02/09/2022    PATIENT IDENTIFIER: Kim Crawford is a 32 y.o. female with a past medical history of T2DM, dyslipidemia, HTN. The patient presented for initial endocrinology clinic visit on 02/09/2022 for consultative assistance with her diabetes management.    HPI: [name]   Diagnosed with DM 2020 Prior Medications tried/Intolerance: Invokana -recurrent  yeast infection                Hemoglobin A1c has ranged from 6.8 % in 2021, peaking at 7.6% in 2023  On her initial visit to our clinic she had an A1c of 7.3%, her Ozempic had recently increased to 1 mg which we continued as well as continuing metformin  By 06/2022 Ozempic was discontinued due to gallbladder issues, she was involved in a class action lawsuit for Ozempic and was not able to restart   She is s/p cholecystectomy 03/2022  SUBJECTIVE:   During the last visit (06/14/2022): A1c 7.0%  Today (10/24/22): [name] is here for follow-up on diabetes management. She endorses hypoglycemia at night , she is symptomatic with these episodes.    Has chronic intermittent right arm and leg numbness, denies cervical issues  Denies nausea, but has constipation   HOME DIABETES REGIMEN: Metformin 500 mg ,2 tabs daily    Statin: no ACE-I/ARB: no  CONTINUOUS GLUCOSE MONITORING RECORD INTERPRETATION    Dates of Recording: 4/23-10/17/2022   Sensor description:freetyle libre  Results statistics:   CGM use % of time 99  Average and SD 158/26.7  Time in range     72   %  % Time Above 180 23  % Time above 250 4  % Time Below target 1   Glycemic patterns summary: BG's optimal overnight and increase during the day   Hyperglycemic episodes  postprandial  Hypoglycemic episodes occurred n/a  Overnight  periods: optimal   DIABETIC COMPLICATIONS: Microvascular complications:   Denies: CKD, retinopaty, neuropathy Last eye exam: Completed 2023  Macrovascular complications:   Denies: CAD, PVD, CVA   PAST HISTORY: Past Medical History:  Past Medical History:  Diagnosis Date   Breast mass, right 2014   Diabetes mellitus without complication ([facility])    Eczema    Headache    migaines   Hypertension    PCOS (polycystic ovarian syndrome)    Past Surgical History:  Past Surgical History:  Procedure Laterality Date   BREAST SURGERY Right 06/13/2012   breast mass   CESAREAN SECTION  06/13/2009   CHOLECYSTECTOMY     COLONOSCOPY WITH PROPOFOL N/A 05/29/2019   Procedure: COLONOSCOPY WITH PROPOFOL;  Surgeon: Toney Reil, MD;  Location: [facility];  Service: Gastroenterology;  Laterality: N/A;   LAPAROSCOPIC VAGINAL HYSTERECTOMY WITH SALPINGECTOMY Bilateral 04/22/2022   Procedure: LAPAROSCOPIC ASSISTED VAGINAL HYSTERECTOMY WITH SALPINGECTOMY;  Surgeon: Schermerhorn, Ihor Austin, MD;  Location: [facility] OR[facility];  Service: Gynecology;  Laterality: Bilateral;   LEEP N/A 09/23/2021   Procedure: LOOP ELECTROSURGICAL EXCISION PROCEDURE (LEEP);  Surgeon: Schermerhorn, Ihor Austin, MD;  Location: [facility] OR[facility];  Service: Gynecology;  Laterality: N/A;   PILONIDAL CYST EXCISION N/A 02/27/2018   Procedure: CYST EXCISION PILONIDAL SIMPLE;  Surgeon: Leafy Ro, MD;  Location: [facility] OR[facility];  Service: General;  Laterality: N/A;    Social History:  reports that she has been smoking cigarettes. She has a 6.00 pack-year smoking history. She has never used smokeless tobacco. She reports current alcohol use. She reports that she does not use drugs. Family History:  Family History  Adopted: Yes  Problem Relation Age of Onset   Hypertension Mother    Diabetes Mother    Parkinson's disease Father      HOME MEDICATIONS: Allergies as of 10/24/2022       Reactions   Toradol [ketorolac Tromethamine] Other (See  Comments)   "Suicide headaches" says that she can take in lower doses.   Tramadol Other (See Comments)   Severe headaches   Invokana [canagliflozin] Other (See Comments)   Yeast infection    Ozempic (0.25 Or 0.5 Mg-dose) [semaglutide(0.25 Or 0.5mg -dos)] Other (See Comments)   Did not feel good and developed gallstones    Hydrocodone Other (See Comments)   Severe migraine headaches (Can take in small doses), states that she is able to now tolerate   Penicillins Rash, Other (See Comments)   Has patient had a PCN reaction causing immediate rash, facial/tongue/throat swelling, SOB or lightheadedness with hypotension: Unknown Has patient had a PCN reaction causing severe rash involving mucus membranes or skin necrosis: No Has patient had a PCN reaction that required hospitalization: No Has patient had a PCN reaction occurring within the last 10 years: No If all of the above answers are "NO", then may proceed with Cephalosporin use.        Medication List        Accurate as of Oct 24, 2022 10:30 AM. If you have any questions, ask your nurse or doctor.          blood glucose meter kit and supplies Kit Dispense based on patient and insurance preference. Use up to four times daily as directed. (FOR ICD-9 250.00, 250.01).   buPROPion 150 MG 24 hr tablet Commonly known as: WELLBUTRIN XL Take 150 mg by mouth daily.   FreeStyle Libre 3 Sensor Misc 1 Device by Does not apply route as directed. Place 1 sensor on the skin every 14 days. Use to check glucose continuously   gemfibrozil 600 MG tablet Commonly known as: LOPID Take 600 mg by mouth in the morning and at bedtime.   ibuprofen 800 MG tablet Commonly known as: ADVIL Take 1 tablet (800 mg total) by mouth every 8 (eight) hours as needed for mild pain or moderate pain.   metFORMIN 500 MG 24 hr tablet Commonly known as: GLUCOPHAGE-XR Take 1 tablet (500 mg total) by mouth 2 (two) times daily with a meal.    oxyCODONE-acetaminophen 5-325 MG tablet Commonly known as: PERCOCET/ROXICET Take 1 tablet by mouth 4 (four) times daily as needed.   propranolol 20 MG tablet Commonly known as: INDERAL Take 20 mg by mouth 2 (two) times daily.         ALLERGIES: Allergies  Allergen Reactions   Toradol [Ketorolac Tromethamine] Other (See Comments)    "Suicide headaches" says that she can take in lower doses.   Tramadol Other (See Comments)    Severe headaches   Invokana [Canagliflozin] Other (See Comments)    Yeast infection    Ozempic (0.25 Or 0.5 Mg-Dose) [Semaglutide(0.25 Or 0.5mg -Dos)] Other (See Comments)    Did not feel good and developed gallstones    Hydrocodone Other (See Comments)    Severe migraine headaches (Can take in small doses), states that she is able to now tolerate   Penicillins Rash and Other (See  Comments)    Has patient had a PCN reaction causing immediate rash, facial/tongue/throat swelling, SOB or lightheadedness with hypotension: Unknown Has patient had a PCN reaction causing severe rash involving mucus membranes or skin necrosis: No Has patient had a PCN reaction that required hospitalization: No Has patient had a PCN reaction occurring within the last 10 years: No If all of the above answers are "NO", then may proceed with Cephalosporin use.      REVIEW OF SYSTEMS: A comprehensive ROS was conducted with the patient and is negative except as per HPI and below:      OBJECTIVE:   VITAL SIGNS: BP 134/80 (BP Location: Left Arm, Patient Position: Sitting, Cuff Size: Large)   Pulse 82   Ht 5\' 5"  (1.651 m)   Wt 189 lb (85.7 kg)   SpO2 98%   BMI 31.45 kg/m    PHYSICAL EXAM:  General: Pt appears well and is in NAD  Lungs: Clear with good BS bilat   Heart: RRR   Abdomen: soft, nontender  Extremities:  Lower extremities - No pretibial edema.  Neuro: MS is good with appropriate affect, pt is alert and Ox3    DM foot exam: 10/24/2022  The skin of the feet is  intact without sores or ulcerations. The pedal pulses are 2+ on right and 2+ on left. The sensation is intact to a screening 5.07, 10 gram monofilament bilaterally    DATA REVIEWED:  Lab Results  Component Value Date   HGBA1C 7.0 (A) 06/14/2022   HGBA1C 5.9 (H) 03/23/2022   HGBA1C 7.5 (H) 05/27/2019   Lab Results  Component Value Date   CREATININE 0.58 03/25/2022   08/30/2022 BUN/Cr 10/0.6 GFR 121 Tg 156 LDL 91 HDL 36.1 TSH 0.973 M5H 7.2%    ASSESSMENT / PLAN / RECOMMENDATIONS:   1) Type 2 Diabetes Mellitus, Sub- optimally controlled, Without complications - Most recent A1c of 7.2 %. Goal A1c < 7.0 %.    - A1c remains To be slightly above goal - She stopped Ozempic due to gallbladder issues,  she is s/p cholecystectomy in 2023 - She developed 2 yeast  infections while on Invokana - she had declined increasing metformin in the past and opted for lifestyle changes -She has been noted with hypoglycemia at night, patient tells me she attempted to confirm this through her glucose meter, and there was no eating.  I again discussed with the patient that the studies did not show any evidence of hypoglycemia on metformin, and I suspect this is more of an error -She was given a new Accu-Chek guide meter with extra strips -No changes at this time    MEDICATIONS: Continue metformin 500 mg XR, 2 tabs daily   EDUCATION / INSTRUCTIONS: BG monitoring instructions: Patient is instructed to check her blood sugars 3 times a day, before meals .   2) Diabetic complications:  Eye: Does not have known diabetic retinopathy.  Neuro/ Feet: Does not have known diabetic peripheral neuropathy. Renal: Patient does not have known baseline CKD. She is not on an ACEI/ARB at present.  3) Hypertriglyceridemia    - She had an elevated Tg as high as 839 mg/dL in 01/4695 , that's when she was started on Gemfibrozil  - Repeat Tg and LDL normalized     F/U in 6 months    Signed electronically  by: Lyndle Herrlich, MD  [facility] Endocrinology  [facility] [address] [place], [place] [address] Phone: [phone]  FAX: [phone]   CC: Marguarite Arbour, MD [address] [facility] [place] [address] Phone: [phone]  Fax: [phone]    Return to Endocrinology clinic as below: Future Appointments  Date Time Provider Department Center  10/27/2022  9:00 AM Deirdre Evener, MD [facility] None

## 2022-10-27 ENCOUNTER — Ambulatory Visit: Payer: Medicaid Other | Admitting: Dermatology

## 2022-10-27 VITALS — BP 127/89

## 2022-10-27 DIAGNOSIS — L820 Inflamed seborrheic keratosis: Secondary | ICD-10-CM

## 2022-10-27 DIAGNOSIS — D229 Melanocytic nevi, unspecified: Secondary | ICD-10-CM

## 2022-10-27 DIAGNOSIS — L918 Other hypertrophic disorders of the skin: Secondary | ICD-10-CM

## 2022-10-27 DIAGNOSIS — D22121 Melanocytic nevi of left upper eyelid, including canthus: Secondary | ICD-10-CM

## 2022-10-27 DIAGNOSIS — D225 Melanocytic nevi of trunk: Secondary | ICD-10-CM

## 2022-10-27 DIAGNOSIS — L720 Epidermal cyst: Secondary | ICD-10-CM

## 2022-10-27 DIAGNOSIS — D492 Neoplasm of unspecified behavior of bone, soft tissue, and skin: Secondary | ICD-10-CM

## 2022-10-27 NOTE — Progress Notes (Signed)
   New Patient Visit   Subjective  Kim Crawford is a 32 y.o. female who presents for the following: check mole L eyelid, has had for yrs, growing, skin tags neck, L axilla The patient has spots, moles and lesions to be evaluated, some may be new or changing and the patient may have concern these could be cancer.  The following portions of the chart were reviewed this encounter and updated as appropriate: medications, allergies, medical history  Review of Systems:  No other skin or systemic complaints except as noted in HPI or Assessment and Plan.  Objective  Well appearing patient in no apparent distress; mood and affect are within normal limits.   A focused examination was performed of the following areas: Face, neck, axilla  Relevant exam findings are noted in the Assessment and Plan.  L upper eyelid Flesh brown pap 0.7cm     L neck x 7, R neck x 1 (8) Stuck on waxy paps with erythema   Assessment & Plan   MELANOCYTIC NEVI Exam: fleshy papule L chest Treatment Plan: Benign appearing on exam today. Recommend observation. Call clinic for new or changing moles. Recommend daily use of broad spectrum spf 30+ sunscreen to sun-exposed areas.    Acrochordons (Skin Tags) - Fleshy, skin-colored pedunculated papules - Benign appearing.  - Observe. - If desired, they can be removed with an in office procedure that is not covered by insurance. - Please call the clinic if you notice any new or changing lesions.  - L axilla  Neoplasm of skin L upper eyelid  Skin excision  Lesion length (cm):  0.7 Lesion width (cm):  0.7 Margin per side (cm):  0 Total excision diameter (cm):  0.7 Informed consent: discussed and consent obtained   Timeout: patient name, date of birth, surgical site, and procedure verified   Procedure prep:  Patient was prepped and draped in usual sterile fashion Prep type:  Isopropyl alcohol and povidone-iodine Anesthesia: the lesion was anesthetized in  a standard fashion   Anesthetic:  1% lidocaine w/ epinephrine 1-100,000 buffered w/ 8.4% NaHCO3 Instrument used: #15 blade   Hemostasis achieved with: pressure   Hemostasis achieved with comment:  Electrocautery Outcome: patient tolerated procedure well with no complications   Post-procedure details: sterile dressing applied and wound care instructions given   Dressing type: bandage, pressure dressing and bacitracin (Mupirocin)    Specimen 1 - Surgical pathology Differential Diagnosis: D48.5 Irritated Nevus r/o Atypia  Check Margins: yes Flesh brown pap 0.7cm  Inflamed seborrheic keratosis (8) L neck x 7, R neck x 1  Symptomatic, irritating, patient would like treated.   Epidermal / dermal shaving - L neck x 7, R neck x 1  Lesion diameter (cm):  0.2 Informed consent: discussed and consent obtained   Informed consent comment:  Snip excision Anesthesia: the lesion was anesthetized in a standard fashion   Anesthetic:  1% lidocaine w/ epinephrine 1-100,000 buffered w/ 8.4% NaHCO3 Instrument used: scissors   Hemostasis achieved with: pressure, aluminum chloride and electrodesiccation   Outcome: patient tolerated procedure well   Post-procedure details: wound care instructions given    Melanocytic nevus, unspecified location  Skin tags, multiple acquired    No follow-ups on file.  I, Ardis Rowan, RMA, am acting as scribe for Armida Sans, MD .  Documentation: I have reviewed the above documentation for accuracy and completeness, and I agree with the above.  Armida Sans, MD

## 2022-10-27 NOTE — Patient Instructions (Addendum)
Wound Care Instructions  Cleanse wound gently with soap and water once a day then pat dry with clean gauze. Apply a thin coat of Petrolatum (petroleum jelly, "Vaseline") over the wound (unless you have an allergy to this). We recommend that you use a new, sterile tube of Vaseline. Do not pick or remove scabs. Do not remove the yellow or white "healing tissue" from the base of the wound.  Cover the wound with fresh, clean, nonstick gauze and secure with paper tape. You may use Band-Aids in place of gauze and tape if the wound is small enough, but would recommend trimming much of the tape off as there is often too much. Sometimes Band-Aids can irritate the skin.  You should call the office for your biopsy report after 1 week if you have not already been contacted.  If you experience any problems, such as abnormal amounts of bleeding, swelling, significant bruising, significant pain, or evidence of infection, please call the office immediately.  FOR ADULT SURGERY PATIENTS: If you need something for pain relief you may take 1 extra strength Tylenol (acetaminophen) AND 2 Ibuprofen (200mg each) together every 4 hours as needed for pain. (do not take these if you are allergic to them or if you have a reason you should not take them.) Typically, you may only need pain medication for 1 to 3 days.     Due to recent changes in healthcare laws, you may see results of your pathology and/or laboratory studies on MyChart before the doctors have had a chance to review them. We understand that in some cases there may be results that are confusing or concerning to you. Please understand that not all results are received at the same time and often the doctors may need to interpret multiple results in order to provide you with the best plan of care or course of treatment. Therefore, we ask that you please give us 2 business days to thoroughly review all your results before contacting the office for clarification. Should  we see a critical lab result, you will be contacted sooner.   If You Need Anything After Your Visit  If you have any questions or concerns for your doctor, please call our main line at [phone] and press option 4 to reach your doctor's medical assistant. If no one answers, please leave a voicemail as directed and we will return your call as soon as possible. Messages left after 4 pm will be answered the following business day.   You may also send us a message via MyChart. We typically respond to MyChart messages within 1-2 business days.  For prescription refills, please ask your pharmacy to contact our office. Our fax number is [phone].  If you have an urgent issue when the clinic is closed that cannot wait until the next business day, you can page your doctor at the number below.    Please note that while we do our best to be available for urgent issues outside of office hours, we are not available 24/7.   If you have an urgent issue and are unable to reach us, you may choose to seek medical care at your doctor's office, retail clinic, urgent care center, or emergency room.  If you have a medical emergency, please immediately call 911 or go to the emergency department.  Pager Numbers  - Dr. Kowalski: [phone]  - Dr. Moye: [phone]  - Dr. Stewart: [phone]  In the event of inclement weather, please call our main line at   [phone] for an update on the status of any delays or closures.  Dermatology Medication Tips: Please keep the boxes that topical medications come in in order to help keep track of the instructions about where and how to use these. Pharmacies typically print the medication instructions only on the boxes and not directly on the medication tubes.   If your medication is too expensive, please contact our office at [phone] option 4 or send us a message through MyChart.   We are unable to tell what your co-pay for medications will be in  advance as this is different depending on your insurance coverage. However, we may be able to find a substitute medication at lower cost or fill out paperwork to get insurance to cover a needed medication.   If a prior authorization is required to get your medication covered by your insurance company, please allow us 1-2 business days to complete this process.  Drug prices often vary depending on where the prescription is filled and some pharmacies may offer cheaper prices.  The website www.goodrx.com contains coupons for medications through different pharmacies. The prices here do not account for what the cost may be with help from insurance (it may be cheaper with your insurance), but the website can give you the price if you did not use any insurance.  - You can print the associated coupon and take it with your prescription to the pharmacy.  - You may also stop by our office during regular business hours and pick up a GoodRx coupon card.  - If you need your prescription sent electronically to a different pharmacy, notify our office through [facility] MyChart or by phone at [phone] option 4.     Si Usted Necesita Algo Despus de Su Visita  Tambin puede enviarnos un mensaje a travs de MyChart. Por lo general respondemos a los mensajes de MyChart en el transcurso de 1 a 2 das hbiles.  Para renovar recetas, por favor pida a su farmacia que se ponga en contacto con nuestra oficina. Nuestro nmero de fax es el [phone].  Si tiene un asunto urgente cuando la clnica est cerrada y que no puede esperar hasta el siguiente da hbil, puede llamar/localizar a su doctor(a) al nmero que aparece a continuacin.   Por favor, tenga en cuenta que aunque hacemos todo lo posible para estar disponibles para asuntos urgentes fuera del horario de oficina, no estamos disponibles las 24 horas del da, los 7 das de la semana.   Si tiene un problema urgente y no puede comunicarse con nosotros, puede  optar por buscar atencin mdica  en el consultorio de su doctor(a), en una clnica privada, en un centro de atencin urgente o en una sala de emergencias.  Si tiene una emergencia mdica, por favor llame inmediatamente al 911 o vaya a la sala de emergencias.  Nmeros de bper  - Dr. Kowalski: [phone]  - Dra. Moye: [phone]  - Dra. Stewart: [phone]  En caso de inclemencias del tiempo, por favor llame a nuestra lnea principal al [phone] para una actualizacin sobre el estado de cualquier retraso o cierre.  Consejos para la medicacin en dermatologa: Por favor, guarde las cajas en las que vienen los medicamentos de uso tpico para ayudarle a seguir las instrucciones sobre dnde y cmo usarlos. Las farmacias generalmente imprimen las instrucciones del medicamento slo en las cajas y no directamente en los tubos del medicamento.   Si su medicamento es muy caro, por favor, pngase en contacto con   nuestra oficina llamando al [phone] y presione la opcin 4 o envenos un mensaje a travs de MyChart.   No podemos decirle cul ser su copago por los medicamentos por adelantado ya que esto es diferente dependiendo de la cobertura de su seguro. Sin embargo, es posible que podamos encontrar un medicamento sustituto a menor costo o llenar un formulario para que el seguro cubra el medicamento que se considera necesario.   Si se requiere una autorizacin previa para que su compaa de seguros cubra su medicamento, por favor permtanos de 1 a 2 das hbiles para completar este proceso.  Los precios de los medicamentos varan con frecuencia dependiendo del lugar de dnde se surte la receta y alguna farmacias pueden ofrecer precios ms baratos.  El sitio web www.goodrx.com tiene cupones para medicamentos de diferentes farmacias. Los precios aqu no tienen en cuenta lo que podra costar con la ayuda del seguro (puede ser ms barato con su seguro), pero el sitio web puede darle el  precio si no utiliz ningn seguro.  - Puede imprimir el cupn correspondiente y llevarlo con su receta a la farmacia.  - Tambin puede pasar por nuestra oficina durante el horario de atencin regular y recoger una tarjeta de cupones de GoodRx.  - Si necesita que su receta se enve electrnicamente a una farmacia diferente, informe a nuestra oficina a travs de MyChart de [facility] o por telfono llamando al [phone] y presione la opcin 4.  

## 2022-11-03 ENCOUNTER — Telehealth: Payer: Self-pay

## 2022-11-03 NOTE — Telephone Encounter (Signed)
-----   Message from Deirdre Evener, MD sent at 11/02/2022  7:22 PM EDT ----- Diagnosis Skin , left upper eyelid MELANOCYTIC NEVUS, COMPOUND CONGENITAL TYPE AND EPIDERMOID CYST, IRRITATED, DEEP MARGIN INVOLVED  Benign mole with cyst Recheck next visit

## 2022-11-03 NOTE — Telephone Encounter (Signed)
-----   Message from David C Kowalski, MD sent at 11/02/2022  7:22 PM EDT ----- Diagnosis Skin , left upper eyelid MELANOCYTIC NEVUS, COMPOUND CONGENITAL TYPE AND EPIDERMOID CYST, IRRITATED, DEEP MARGIN INVOLVED  Benign mole with cyst Recheck next visit 

## 2022-11-03 NOTE — Telephone Encounter (Signed)
Unable to leave voicemail, voicemail box not set up.

## 2022-11-03 NOTE — Telephone Encounter (Signed)
Advised pt of bx results/sh ?

## 2022-11-10 ENCOUNTER — Encounter: Payer: Self-pay | Admitting: Dermatology

## 2023-01-11 ENCOUNTER — Encounter: Payer: Self-pay | Admitting: Otolaryngology

## 2023-01-11 ENCOUNTER — Other Ambulatory Visit: Payer: Self-pay

## 2023-01-11 MED ORDER — CIPROFLOXACIN-DEXAMETHASONE 0.3-0.1 % OT SUSP
4.0000 [drp] | Freq: Two times a day (BID) | OTIC | 0 refills | Status: DC
  Filled 2023-01-11: qty 7.5, 19d supply, fill #0

## 2023-01-16 NOTE — Discharge Instructions (Signed)
[facility] DISCHARGE INSTRUCTIONS FOR MYRINGOTOMY AND TUBE INSERTION  [facility] Bud Face, M.D.   Diet:   After surgery, the patient should take only liquids and foods as tolerated.  The patient may then have a regular diet after the effects of anesthesia have worn off, usually about four to six hours after surgery.  Activities:   The patient should rest until the effects of anesthesia have worn off.  After this, there are no restrictions on the normal daily activities.  Medications:   You will be given a prescription for antibiotic drops to be used in the ears postoperatively.  It is recommended to use 4 drops 2 times a day for 4 days, then the drops should be saved for possible future use.  The tubes should not cause any discomfort to the patient, but if there is any question, Tylenol should be given according to the instructions for the age of the patient.  Other medications should be continued normally.  Precautions:   Should there be recurrent drainage after the tubes are placed, the drops should be used for approximately 3-4 days.  If it does not clear, you should call the ENT office.  Earplugs:   Earplugs are only needed for those who are going to be submerged under water.  When taking a bath or shower and using a cup or showerhead to rinse hair, it is not necessary to wear earplugs.  These come in a variety of fashions, all of which can be obtained at our office.  However, if one is not able to come by the office, then silicone plugs can be found at most pharmacies.  It is not advised to stick anything in the ear that is not approved as an earplug.  Silly putty is not to be used as an earplug.  Swimming is allowed in patients after ear tubes are inserted, however, they must wear earplugs if they are going to be submerged under water.  For those children who are going to be swimming a lot, it is recommended to use a fitted ear mold, which can be  made by our audiologist.  If discharge is noticed from the ears, this most likely represents an ear infection.  We would recommend getting your eardrops and using them as indicated above.  If it does not clear, then you should call the ENT office.  For follow up, the patient should return to the ENT office three weeks postoperatively and then every six months as required by the doctor.[facility] DISCHARGE INSTRUCTIONS FOR MYRINGOTOMY AND TUBE INSERTION  [place] [facility] Bud Face, M.D.   Diet:   After surgery, the patient should take only liquids and foods as tolerated.  The patient may then have a regular diet after the effects of anesthesia have worn off, usually about four to six hours after surgery.  Activities:   The patient should rest until the effects of anesthesia have worn off.  After this, there are no restrictions on the normal daily activities.  Medications:   You will be given a prescription for antibiotic drops to be used in the ears postoperatively.  It is recommended to use 4 drops 2 times a day for 4 days, then the drops should be saved for possible future use.  The tubes should not cause any discomfort to the patient, but if there is any question, Tylenol should be given according to the instructions for the age of the  patient.  Other medications should be continued normally.  Precautions:   Should there be recurrent drainage after the tubes are placed, the drops should be used for approximately 3-4 days.  If it does not clear, you should call the ENT office.  Earplugs:   Earplugs are only needed for those who are going to be submerged under water.  When taking a bath or shower and using a cup or showerhead to rinse hair, it is not necessary to wear earplugs.  These come in a variety of fashions, all of which can be obtained at our office.  However, if one is not able to come by the office, then silicone plugs can be found at most pharmacies.   It is not advised to stick anything in the ear that is not approved as an earplug.  Silly putty is not to be used as an earplug.  Swimming is allowed in patients after ear tubes are inserted, however, they must wear earplugs if they are going to be submerged under water.  For those children who are going to be swimming a lot, it is recommended to use a fitted ear mold, which can be made by our audiologist.  If discharge is noticed from the ears, this most likely represents an ear infection.  We would recommend getting your eardrops and using them as indicated above.  If it does not clear, then you should call the ENT office.  For follow up, the patient should return to the ENT office three weeks postoperatively and then every six months as required by the doctor.[facility] DISCHARGE INSTRUCTIONS FOR MYRINGOTOMY AND TUBE INSERTION  [place] [facility] Bud Face, M.D.   Diet:   After surgery, the patient should take only liquids and foods as tolerated.  The patient may then have a regular diet after the effects of anesthesia have worn off, usually about four to six hours after surgery.  Activities:   The patient should rest until the effects of anesthesia have worn off.  After this, there are no restrictions on the normal daily activities.  Medications:   You will be given a prescription for antibiotic drops to be used in the ears postoperatively.  It is recommended to use 4 drops 2 times a day for 4 days, then the drops should be saved for possible future use.  The tubes should not cause any discomfort to the patient, but if there is any question, Tylenol should be given according to the instructions for the age of the patient.  Other medications should be continued normally.  Precautions:   Should there be recurrent drainage after the tubes are placed, the drops should be used for approximately 3-4 days.  If it does not clear, you should call the ENT  office.  Earplugs:   Earplugs are only needed for those who are going to be submerged under water.  When taking a bath or shower and using a cup or showerhead to rinse hair, it is not necessary to wear earplugs.  These come in a variety of fashions, all of which can be obtained at our office.  However, if one is not able to come by the office, then silicone plugs can be found at most pharmacies.  It is not advised to stick anything in the ear that is not approved as an earplug.  Silly putty is not to be used as an earplug.  Swimming is allowed in patients after ear tubes are inserted, however, they  must wear earplugs if they are going to be submerged under water.  For those children who are going to be swimming a lot, it is recommended to use a fitted ear mold, which can be made by our audiologist.  If discharge is noticed from the ears, this most likely represents an ear infection.  We would recommend getting your eardrops and using them as indicated above.  If it does not clear, then you should call the ENT office.  For follow up, the patient should return to the ENT office three weeks postoperatively and then every six months as required by the doctor.  T & A INSTRUCTION SHEET - [facility] [place] [facility]  CREIGHTON VAUGHT, MD   INFORMATION SHEET FOR A TONSILLECTOMY AND ADENDOIDECTOMY  About Your Tonsils and Adenoids  The tonsils and adenoids are normal body tissues that are part of our immune system.  They normally help to protect [place] against diseases that may enter our mouth and nose. However, sometimes the tonsils and/or adenoids become too large and obstruct our breathing, especially at night.    If either of these things happen it helps to remove the tonsils and adenoids in order to become healthier. The operation to remove the tonsils and adenoids is called a tonsillectomy and adenoidectomy.  The Location of Your Tonsils and Adenoids  The tonsils are located in  the back of the throat on both side and sit in a cradle of muscles. The adenoids are located in the roof of the mouth, behind the nose, and closely associated with the opening of the Eustachian tube to the ear.  Surgery on Tonsils and Adenoids  A tonsillectomy and adenoidectomy is a short operation which takes about thirty minutes.  This includes being put to sleep and being awakened. Tonsillectomies and adenoidectomies are performed at [facility] and may require observation period in the recovery room prior to going home. Children are required to remain in recovery for at least 45 minutes.   Following the Operation for a Tonsillectomy  A cautery machine is used to control bleeding. Bleeding from a tonsillectomy and adenoidectomy is minimal and postoperatively the risk of bleeding is approximately four percent, although this rarely life threatening.  After your tonsillectomy and adenoidectomy post-op care at home: 1. Our patients are able to go home the same day. You may be given prescriptions for pain medications, if indicated. 2. It is extremely important to remember that fluid intake is of utmost importance after a tonsillectomy. The amount that you drink must be maintained in the postoperative period. A good indication of whether a child is getting enough fluid is whether his/her urine output is constant. As long as children are urinating or wetting their diaper every 6 - 8 hours this is usually enough fluid intake.   3. Although rare, this is a risk of some bleeding in the first ten days after surgery. This usually occurs between day five and nine postoperatively. This risk of bleeding is approximately four percent. If you or your child should have any bleeding you should remain calm and notify our office or go directly to the emergency room at [facility] where they will contact us. Our doctors are available seven days a week for notification. We recommend sitting up  quietly in a chair, place an ice pack on the front of the neck and spitting out the blood gently until we are able to contact you. Adults should gargle gently  with ice water and this may help stop the bleeding. If the bleeding does not stop after a short time, i.e. 10 to 15 minutes, or seems to be increasing again, please contact us or go to the hospital.   4. It is common for the pain to be worse at 5 - 7 days postoperatively. This occurs because the "scab" is peeling off and the mucous membrane (skin of the throat) is growing back where the tonsils were.   5. It is common for a low-grade fever, less than 102, during the first week after a tonsillectomy and adenoidectomy. It is usually due to not drinking enough liquids, and we suggest your use liquid Tylenol (acetaminophen) or the pain medicine with Tylenol (acetaminophen) prescribed in order to keep your temperature below 102. Please follow the directions on the back of the bottle. 6. Recommendations for post-operative pain in children and adults: a) For Children 12 and younger: Recommendations are for oral Tylenol (acetaminophen) and oral Motrin (Ibuprofen) along with a prescription dose of Prednisolone which is a steroid to help with pain and swelling. Administer the Tylenol (acetaminophen) and Motrin as stated on bottle for patient's age/weight. Sometimes it may be necessary to alternate the Tylenol (acetaminophen) and Motrin for improved pain control. Motrin does last slightly longer so many patients benefit from being given this prior to bedtime. All children should avoid Aspirin products for 2 weeks following surgery. b) For children over the age of 31: Tylenol (acetaminophen) is the preferred first choice for pain control. Depending on your child's size, sometimes they will be given a combination of Tylenol (acetaminophen) and hydrocodone medication or sometimes it will be recommended they take Motrin (ibuprofen) in addition to the Tylenol  (acetaminophen). Narcotics should always be used with caution in children following surgery as they can suppress their breathing and switching to over the counter Tylenol (acetaminophen) and Motrin (ibuprofen) as soon as possible is recommended. All patients should avoid Aspirin products for 2 weeks following surgery. c) Adults: Usually adults will require a narcotic pain medication following a tonsillectomy. This usually has either hydrocodone or oxycodone in it and can usually be taken every 4 to 6 hours as needed for moderate pain. If the medication does not have Tylenol (acetaminophen) in it, you may also supplement Tylenol (acetaminophen) as needed every 4 to 6 hours for breakthrough or mild pain. Adults are also given Viscous Lidocaine to swish and spit every 6 hours to help with topical pain. Adults should avoid Aspirin, Aleve, Motrin, and Ibuprofen products for 2 weeks following surgery as they can increase your risk of bleeding. 7. If you happen to look in the mirror or into your child's mouth you will see white/gray patches on the back of the throat. This is what a scab looks like in the mouth and is normal after having a tonsillectomy and adenoidectomy. They will disappear once the tonsil areas heal completely. However, it may cause a noticeable odor, and this too will disappear with time.     8. You or your child may experience ear pain after having a tonsillectomy and adenoidectomy.  This is called referred pain and comes from the throat, but it is felt in the ears.  Ear pain is quite common and expected. It will usually go away after ten days. There is usually nothing wrong with the ears, and it is primarily due to the healing area stimulating the nerve to the ear that runs along the side of the throat. Use either the  prescribed pain medicine or Tylenol (acetaminophen) as needed.  9. The throat tissues after a tonsillectomy are obviously sensitive. Smoking around children who have had a  tonsillectomy significantly increases the risk of bleeding. DO NOT SMOKE!  What to Expect Each Day  First Day at Home 1. Patients will be discharged home the same day.  2. Drink at least four glasses of liquid a day. Clear, cool liquids are recommended. Fruit juices containing citric acid are not recommended because they tend to cause pain. Carbonated beverages are allowed if you pour them from glass to glass to remove the bubbles as these tend to cause discomfort. Avoid alcoholic beverages.  3. Eat very soft foods such as soups, broth, jello, custard, pudding, ice cream, popsicles, applesauce, mashed potatoes, and in general anything that you can crush between your tongue and the roof of your mouth. Try adding [employer] Mix into your food for extra calories. It is not uncommon to lose 5 to 10 pounds of fluid weight. The weight will be gained back quickly once you're feeling better and drinking more.  4. Sleep with your head elevated on two pillows for about three days to help decrease the swelling.  5. DO NOT SMOKE!  Day Two  1. Rest as much as possible. Use common sense in your activities.  2. Continue drinking at least four glasses of liquid per day.  3. Follow the soft diet.  4. Use your pain medication as needed.  Day Three  1. Advance your activity as you are able and continue to follow the previous day's suggestions.  Days Four Through Six  1. Advance your diet and begin to eat more solid foods such as chopped hamburger. 2. Advance your activities slowly. Children should be kept mostly around the house.  3. Not uncommonly, there will be more pain at this time. It is temporary, usually lasting a day or two.  Day Seven Through Ten  1. Most individuals by this time are able to return to work or school unless otherwise instructed. Consider sending children back to school for a half day on the first day back.

## 2023-01-18 ENCOUNTER — Ambulatory Visit: Payer: Medicaid Other | Admitting: Anesthesiology

## 2023-01-18 ENCOUNTER — Ambulatory Visit
Admission: RE | Admit: 2023-01-18 | Discharge: 2023-01-18 | Disposition: A | Discharge status: Home / Self Care | Payer: Medicaid Other | Attending: Otolaryngology | Admitting: Otolaryngology

## 2023-01-18 ENCOUNTER — Encounter: Payer: Self-pay | Admitting: Otolaryngology

## 2023-01-18 ENCOUNTER — Encounter
Admission: RE | Disposition: A | Discharge status: Home / Self Care | Payer: Self-pay | Source: Home / Self Care | Attending: Otolaryngology

## 2023-01-18 ENCOUNTER — Other Ambulatory Visit: Payer: Self-pay

## 2023-01-18 DIAGNOSIS — I10 Essential (primary) hypertension: Secondary | ICD-10-CM | POA: Insufficient documentation

## 2023-01-18 DIAGNOSIS — H6990 Unspecified Eustachian tube disorder, unspecified ear: Secondary | ICD-10-CM | POA: Insufficient documentation

## 2023-01-18 DIAGNOSIS — J353 Hypertrophy of tonsils with hypertrophy of adenoids: Secondary | ICD-10-CM | POA: Insufficient documentation

## 2023-01-18 DIAGNOSIS — F17210 Nicotine dependence, cigarettes, uncomplicated: Secondary | ICD-10-CM | POA: Insufficient documentation

## 2023-01-18 DIAGNOSIS — Z79899 Other long term (current) drug therapy: Secondary | ICD-10-CM | POA: Diagnosis not present

## 2023-01-18 DIAGNOSIS — Z7984 Long term (current) use of oral hypoglycemic drugs: Secondary | ICD-10-CM | POA: Insufficient documentation

## 2023-01-18 DIAGNOSIS — E119 Type 2 diabetes mellitus without complications: Secondary | ICD-10-CM | POA: Insufficient documentation

## 2023-01-18 HISTORY — PX: MYRINGOTOMY WITH TUBE PLACEMENT: SHX5663

## 2023-01-18 HISTORY — PX: TONSILLECTOMY: SHX5217

## 2023-01-18 LAB — GLUCOSE, CAPILLARY: Glucose-Capillary: 160 mg/dL — ABNORMAL HIGH (ref 70–99)

## 2023-01-18 SURGERY — MYRINGOTOMY WITH TUBE PLACEMENT
Anesthesia: General | Laterality: Right

## 2023-01-18 MED ORDER — MIDAZOLAM HCL 5 MG/5ML IJ SOLN
INTRAMUSCULAR | Status: DC | PRN
  Administered 2023-01-18: 2 mg via INTRAVENOUS

## 2023-01-18 MED ORDER — FENTANYL CITRATE (PF) 100 MCG/2ML IJ SOLN
INTRAMUSCULAR | Status: DC | PRN
  Administered 2023-01-18: 50 ug via INTRAVENOUS

## 2023-01-18 MED ORDER — SUCCINYLCHOLINE CHLORIDE 200 MG/10ML IV SOSY
PREFILLED_SYRINGE | INTRAVENOUS | Status: DC | PRN
  Administered 2023-01-18: 120 mg via INTRAVENOUS

## 2023-01-18 MED ORDER — OXYMETAZOLINE HCL 0.05 % NA SOLN
NASAL | Status: DC | PRN
  Administered 2023-01-18: 1 via TOPICAL

## 2023-01-18 MED ORDER — LACTATED RINGERS IV SOLN: INTRAVENOUS | Status: DC

## 2023-01-18 MED ORDER — CIPROFLOXACIN-DEXAMETHASONE 0.3-0.1 % OT SUSP
OTIC | Status: DC | PRN
  Administered 2023-01-18: 4 [drp] via OTIC

## 2023-01-18 MED ORDER — ONDANSETRON HCL 4 MG PO TABS: 4.0000 mg | ORAL_TABLET | Freq: Three times a day (TID) | ORAL | 0 refills | Status: DC | PRN

## 2023-01-18 MED ORDER — SUGAMMADEX SODIUM 200 MG/2ML IV SOLN
INTRAVENOUS | Status: DC | PRN
  Administered 2023-01-18: 172.8 mg via INTRAVENOUS

## 2023-01-18 MED ORDER — LIDOCAINE VISCOUS HCL 2 % MT SOLN: 10.0000 mL | Freq: Four times a day (QID) | OROMUCOSAL | 0 refills | Status: DC | PRN

## 2023-01-18 MED ORDER — PROPOFOL 10 MG/ML IV BOLUS
INTRAVENOUS | Status: DC | PRN
  Administered 2023-01-18: 200 mg via INTRAVENOUS

## 2023-01-18 MED ORDER — ONDANSETRON HCL 4 MG/2ML IJ SOLN
INTRAMUSCULAR | Status: DC | PRN
  Administered 2023-01-18: 4 mg via INTRAVENOUS

## 2023-01-18 MED ORDER — ROCURONIUM BROMIDE 100 MG/10ML IV SOLN
INTRAVENOUS | Status: DC | PRN
  Administered 2023-01-18: 30 mg via INTRAVENOUS

## 2023-01-18 MED ORDER — BUPIVACAINE HCL (PF) 0.25 % IJ SOLN
INTRAMUSCULAR | Status: DC | PRN
  Administered 2023-01-18: 2.5 mL

## 2023-01-18 MED ORDER — OXYCODONE HCL 5 MG/5ML PO SOLN
10.0000 mg | Freq: Once | ORAL | Status: AC
  Administered 2023-01-18: 10 mg via ORAL

## 2023-01-18 MED ORDER — ALBUTEROL SULFATE HFA 108 (90 BASE) MCG/ACT IN AERS
INHALATION_SPRAY | RESPIRATORY_TRACT | Status: DC | PRN
  Administered 2023-01-18: 6 via RESPIRATORY_TRACT

## 2023-01-18 MED ORDER — OXYCODONE HCL 5 MG/5ML PO SOLN: 5.0000 mg | ORAL | 0 refills | Status: DC | PRN

## 2023-01-18 MED ORDER — DEXAMETHASONE SODIUM PHOSPHATE 4 MG/ML IJ SOLN
INTRAMUSCULAR | Status: DC | PRN
  Administered 2023-01-18: 8 mg via INTRAVENOUS

## 2023-01-18 MED ORDER — LIDOCAINE HCL (CARDIAC) PF 100 MG/5ML IV SOSY
PREFILLED_SYRINGE | INTRAVENOUS | Status: DC | PRN
  Administered 2023-01-18: 80 mg via INTRAVENOUS

## 2023-01-18 SURGICAL SUPPLY — 22 items
ANTIFOG SOL W/FOAM PAD STRL (MISCELLANEOUS) ×2
BALL CTTN LRG ABS STRL LF (GAUZE/BANDAGES/DRESSINGS) ×2
BLADE ELECT COATED/INSUL 125 (ELECTRODE) ×2 IMPLANT
BLADE MYR LANCE NRW W/HDL (BLADE) IMPLANT
CANISTER SUCT 1200ML W/VALVE (MISCELLANEOUS) ×2 IMPLANT
CATH ROBINSON RED A/P 10FR (CATHETERS) ×2 IMPLANT
COTTONBALL LRG STERILE PKG (GAUZE/BANDAGES/DRESSINGS) ×2 IMPLANT
ELECT REM PT RETURN 9FT ADLT (ELECTROSURGICAL) ×2
ELECTRODE REM PT RTRN 9FT ADLT (ELECTROSURGICAL) ×2 IMPLANT
GLOVE SURG GAMMEX PI TX LF 7.5 (GLOVE) ×2 IMPLANT
KIT TURNOVER KIT A (KITS) ×2 IMPLANT
PACK TONSIL AND ADENOID CUSTOM (PACKS) ×2 IMPLANT
PENCIL SMOKE EVACUATOR (MISCELLANEOUS) ×2 IMPLANT
SLEEVE SUCTION 125 (MISCELLANEOUS) ×2 IMPLANT
SOLUTION ANTFG W/FOAM PAD STRL (MISCELLANEOUS) ×2 IMPLANT
SPONGE TONSIL 1 RF SGL (DISPOSABLE) IMPLANT
STRAP BODY AND KNEE 60X3 (MISCELLANEOUS) ×2 IMPLANT
SUCTION COAG ELEC 10 HAND CTRL (ELECTROSURGICAL) IMPLANT
SYR 5ML LL (SYRINGE) ×2 IMPLANT
TOWEL OR 17X26 4PK STRL BLUE (TOWEL DISPOSABLE) ×2 IMPLANT
TUBE EAR VENT ARMSTRNG FM 1.14 (TUBING) IMPLANT
TUBING SUCTION CONN 0.25 STRL (TUBING) ×2 IMPLANT

## 2023-01-18 NOTE — H&P (Signed)
..  History and Physical paper copy reviewed and updated date of procedure and will be scanned into system.  Patient seen and examined and new H&P created.

## 2023-01-18 NOTE — Transfer of Care (Signed)
Immediate Anesthesia Transfer of Care Note  Patient: [name]  Procedure(s) Performed: MYRINGOTOMY WITH TUBE PLACEMENT (Right) TONSILLECTOMY (Bilateral)  Patient Location: PACU  Anesthesia Type: General ETT  Level of Consciousness: awake, alert  and patient cooperative  Airway and Oxygen Therapy: Patient Spontanous Breathing and Patient connected to supplemental oxygen  Post-op Assessment: Post-op Vital signs reviewed, Patient's Cardiovascular Status Stable, Respiratory Function Stable, Patent Airway and No signs of Nausea or vomiting  Post-op Vital Signs: Reviewed and stable  Complications: No notable events documented.

## 2023-01-18 NOTE — Anesthesia Postprocedure Evaluation (Signed)
Anesthesia Post Note  Patient: Kim Crawford  Procedure(s) Performed: MYRINGOTOMY WITH TUBE PLACEMENT (Right) TONSILLECTOMY (Bilateral)  Patient location during evaluation: PACU Anesthesia Type: General Level of consciousness: awake and alert Pain management: pain level controlled Vital Signs Assessment: post-procedure vital signs reviewed and stable Respiratory status: spontaneous breathing, nonlabored ventilation, respiratory function stable and patient connected to nasal cannula oxygen Cardiovascular status: stable and blood pressure returned to baseline Postop Assessment: no apparent nausea or vomiting Anesthetic complications: no   No notable events documented.   Last Vitals:  Vitals:   01/18/23 1130 01/18/23 1145  BP: (!) 115/95 (!) 140/98  Pulse: 98 93  Resp: 18 16  Temp:    SpO2: 96% 97%    Last Pain:  Vitals:   01/18/23 1125  TempSrc:   PainSc: 8                  Louie Boston

## 2023-01-18 NOTE — Op Note (Signed)
..  01/18/2023  11:04 AM    Kim Crawford  [id number]   Pre-Op Dx:  tonsillar asymmetry eustachian tube dysfunction  Post-op Dx: tonsillar asymmetry eustachian tube dysfunction  Proc:  1)  Tonsillectomy and Adenoidectomy < age 34  2)  Right Myringotomy and Tympanostomy tube placement  Surg:    Anes:  General Endotracheal  EBL:  <10ml  Comp:  None  Findings:  3+ tonsils, absent adenoids, retraction on right and PE tube placed  Procedure: After the patient was identified in holding and the history and physical and consent was reviewed, the patient was taken to the operating room and placed in a supine position.  General endotracheal anesthesia was induced in the normal fashion.    At an appropriate level, microscope and speculum were used to examine and clean the RIGHT ear canal.  The findings were as described above.  An anterior inferior radial myringotomy incision was sharply executed.  Middle ear contents were suctioned clear with a size 5 otologic suction.  A PE tube was placed without difficulty using a Rosen pick and Facilities manager.  Ciprodex otic solution was instilled into the external canal, and insufflated into the middle ear.  A cotton ball was placed at the external meatus. Hemostasis was observed.  This side was completed.  At this time, the patient was rotated 45 degrees and a shoulder roll was placed.  At this time, a McIvor mouthgag was inserted into the patient's oral cavity and suspended from the Mayo stand without injury to teeth, lips, or gums.  Next a red rubber catheter was inserted into the patient left nostril for retraction of the uvula and soft palate superiorly.  Next a curved Alice clamp was attached to the patient's right superior tonsillar pole and retracted medially and inferiorly.  A Bovie electrocautery was used to dissect the patient's right tonsil in a subcapsular plane.  Meticulous hemostasis was achieved with Bovie suction cautery.  At  this time, the mouth gag was released from suspension for 1 minute.  Attention now was directed to the patient's left side.  In a similar fashion the curved Alice clamp was attached to the superior pole and this was retracted medially and inferiorly and the tonsil was excised in a subcapsular plane with Bovie electrocautery.  After completion of the second tonsil, meticulous hemostasis was continued.  At this time, attention was directed to the patient's Adenoids  Under indirect visualization using an operating mirror, the adenoid tissue was visualized and noted to be absent in nature.   Meticulous hemostasis was continued.  At this time, the patient's nasal cavity and oral cavity was irrigated with sterile saline.  2.47ml of 0.25% Marcaine was injected into the anterior and posterior tonsillar fossa bilaterally.  Following this  The care of patient was returned to anesthesia, awakened, and transferred to recovery in stable condition.  Dispo:  PACU to home  Plan: Soft diet.  Limit exercise and strenuous activity for 2 weeks.  Fluid hydration  Recheck my office three weeks.     11:04 AM 01/18/2023

## 2023-01-18 NOTE — Anesthesia Preprocedure Evaluation (Signed)
Anesthesia Evaluation  Patient identified by MRN, date of birth, ID band Patient awake    Reviewed: Allergy & Precautions, NPO status , Patient's Chart, lab work & pertinent test results  History of Anesthesia Complications Negative for: history of anesthetic complications  Airway Mallampati: III  TM Distance: >3 FB Neck ROM: full    Dental no notable dental hx.    Pulmonary neg pulmonary ROS, Current Smoker   Pulmonary exam normal        Cardiovascular hypertension, On Medications negative cardio ROS Normal cardiovascular exam     Neuro/Psych  Headaches  negative psych ROS   GI/Hepatic negative GI ROS, Neg liver ROS,,,  Endo/Other  negative endocrine ROSdiabetes, Type 2, Oral Hypoglycemic Agents    Renal/GU      Musculoskeletal   Abdominal   Peds  Hematology negative hematology ROS (+)   Anesthesia Other Findings Past Medical History: 2014: Breast mass, right No date: Diabetes mellitus without complication (HCC) No date: Eczema No date: Headache     Comment:  migaines No date: Hypertension No date: PCOS (polycystic ovarian syndrome)  Past Surgical History: 06/13/2012: BREAST SURGERY; Right     Comment:  breast mass 06/13/2009: CESAREAN SECTION No date: CHOLECYSTECTOMY 05/29/2019: COLONOSCOPY WITH PROPOFOL; N/A     Comment:  Procedure: COLONOSCOPY WITH PROPOFOL;  Surgeon: Toney Reil, MD;  Location: [facility];  Service:               Gastroenterology;  Laterality: N/A; 04/22/2022: LAPAROSCOPIC VAGINAL HYSTERECTOMY WITH SALPINGECTOMY;  Bilateral     Comment:  Procedure: LAPAROSCOPIC ASSISTED VAGINAL HYSTERECTOMY               WITH SALPINGECTOMY;  Surgeon: Schermerhorn, Ihor Austin, MD;              Location: [facility] ORS;  Service: Gynecology;  Laterality:               Bilateral; 09/23/2021: LEEP; N/A     Comment:  Procedure: LOOP ELECTROSURGICAL EXCISION PROCEDURE                (LEEP);  Surgeon: Schermerhorn, Ihor Austin, MD;  Location:               [facility] ORS;  Service: Gynecology;  Laterality: N/A; 02/27/2018: PILONIDAL CYST EXCISION; N/A     Comment:  Procedure: CYST EXCISION PILONIDAL SIMPLE;  Surgeon:               Leafy Ro, MD;  Location: [facility] ORS;  Service:               General;  Laterality: N/A;  BMI    Body Mass Index: 31.45 kg/m      Reproductive/Obstetrics negative OB ROS                             Anesthesia Physical Anesthesia Plan  ASA: 2  Anesthesia Plan: General ETT   Post-op Pain Management: Ofirmev IV (intra-op) and Toradol IV (intra-op)   Induction: Intravenous  PONV Risk Score and Plan: 3 and Ondansetron, Dexamethasone, Midazolam and Treatment may vary due to age or medical condition  Airway Management Planned: Oral ETT  Additional Equipment:   Intra-op Plan:   Post-operative Plan: Extubation in OR  Informed Consent: I have reviewed the patients History and Physical, chart, labs and discussed the procedure including  the risks, benefits and alternatives for the proposed anesthesia with the patient or authorized representative who has indicated his/her understanding and acceptance.     Dental Advisory Given  Plan Discussed with: Anesthesiologist, CRNA and Surgeon  Anesthesia Plan Comments: (Patient consented for risks of anesthesia including but not limited to:  - adverse reactions to medications - damage to eyes, teeth, lips or other oral mucosa - nerve damage due to positioning  - sore throat or hoarseness - Damage to heart, brain, nerves, lungs, other parts of body or loss of life  Patient voiced understanding.)       Anesthesia Quick Evaluation

## 2023-01-19 ENCOUNTER — Encounter: Payer: Self-pay | Admitting: Otolaryngology

## 2023-01-26 ENCOUNTER — Other Ambulatory Visit: Payer: Self-pay

## 2023-01-26 ENCOUNTER — Encounter: Payer: Self-pay | Admitting: Emergency Medicine

## 2023-01-26 ENCOUNTER — Emergency Department
Admission: EM | Admit: 2023-01-26 | Discharge: 2023-01-26 | Disposition: A | Discharge status: Home / Self Care | Payer: Medicaid Other | Attending: Emergency Medicine | Admitting: Emergency Medicine

## 2023-01-26 DIAGNOSIS — J95830 Postprocedural hemorrhage and hematoma of a respiratory system organ or structure following a respiratory system procedure: Secondary | ICD-10-CM | POA: Insufficient documentation

## 2023-01-26 DIAGNOSIS — I10 Essential (primary) hypertension: Secondary | ICD-10-CM | POA: Insufficient documentation

## 2023-01-26 LAB — BASIC METABOLIC PANEL
Anion gap: 9 (ref 5–15)
BUN: 18 mg/dL (ref 6–20)
CO2: 21 mmol/L — ABNORMAL LOW (ref 22–32)
Calcium: 9 mg/dL (ref 8.9–10.3)
Chloride: 101 mmol/L (ref 98–111)
Creatinine, Ser: 0.58 mg/dL (ref 0.44–1.00)
GFR, Estimated: >60 mL/min (ref >60)
Glucose, Bld: 162 mg/dL — ABNORMAL HIGH (ref 70–99)
Potassium: 4.2 mmol/L (ref 3.5–5.1)
Sodium: 131 mmol/L — ABNORMAL LOW (ref 135–145)

## 2023-01-26 LAB — CBC
HCT: 41.1 % (ref 36.0–46.0)
Hemoglobin: 14.2 g/dL (ref 12.0–15.0)
MCH: 30.1 pg (ref 26.0–34.0)
MCHC: 34.5 g/dL (ref 30.0–36.0)
MCV: 87.1 fL (ref 80.0–100.0)
Platelets: 196 10*3/uL (ref 150–400)
RBC: 4.72 MIL/uL (ref 3.87–5.11)
RDW: 12 % (ref 11.5–15.5)
WBC: 11.3 10*3/uL — ABNORMAL HIGH (ref 4.0–10.5)
nRBC: 0 % (ref 0.0–0.2)

## 2023-01-26 MED ORDER — ONDANSETRON 4 MG PO TBDP
4.0000 mg | ORAL_TABLET | Freq: Once | ORAL | Status: AC
  Administered 2023-01-26: 4 mg via ORAL
  Filled 2023-01-26: qty 1

## 2023-01-26 MED ORDER — PREDNISONE 10 MG (21) PO TBPK: ORAL_TABLET | ORAL | 0 refills | Status: DC

## 2023-01-26 MED ORDER — TRANEXAMIC ACID FOR INHALATION
500.0000 mg | Freq: Once | RESPIRATORY_TRACT | Status: AC
  Administered 2023-01-26: 500 mg via RESPIRATORY_TRACT
  Filled 2023-01-26: qty 10

## 2023-01-26 NOTE — ED Provider Notes (Signed)
[facility] Provider Note    Event Date/Time   First MD Initiated Contact with Patient 01/26/23 1418     (approximate)   History   Chief Complaint Post-op Problem   HPI  Kim Crawford is a 32 y.o. female with past medical history of hypertension, diabetes, and PCOS who presents to the ED complaining of postop problem.  Patient had tonsillectomy performed with Dr. Andee Poles 8 days ago, states she has been doing well since then up until earlier today.  Today she felt like the scab fell off of her tonsils and shortly afterwards she started to have some bleeding.  She states that she has had increased pain and has been continuously spitting up blood since then.  She denies any difficulty breathing, does feel like a small amount of blood is going back down into her throat.  She spoke with her ENT provider, who recommended she come to the ED for further evaluation.  She does not take any blood thinners.     Physical Exam   Triage Vital Signs: ED Triage Vitals  Encounter Vitals Group     BP 01/26/23 1252 (!) 137/107     Systolic BP Percentile --      Diastolic BP Percentile --      Pulse Rate 01/26/23 1252 (!) 104     Resp 01/26/23 1252 20     Temp 01/26/23 1252 98.4 F (36.9 C)     Temp Source 01/26/23 1252 Oral     SpO2 01/26/23 1252 98 %     Weight 01/26/23 1251 185 lb (83.9 kg)     Height 01/26/23 1251 5\' 5"  (1.651 m)     Head Circumference --      Peak Flow --      Pain Score 01/26/23 1251 5     Pain Loc --      Pain Education --      Exclude from Growth Chart --     Most recent vital signs: Vitals:   01/26/23 1648 01/26/23 1657  BP: 112/80 112/80  Pulse: 88 80  Resp: 18 18  Temp:  98.4 F (36.9 C)  SpO2: 98% 98%    Constitutional: Alert and oriented. Eyes: Conjunctivae are normal. Head: Atraumatic. Nose: No congestion/rhinnorhea. Mouth/Throat: Mucous membranes are moist.  Patient continuously spitting small amount of blood.   Posterior oropharynx with small amount of blood, appears to be coming from the left side. Cardiovascular: Normal rate, regular rhythm. Grossly normal heart sounds.  2+ radial pulses bilaterally. Respiratory: Normal respiratory effort.  No retractions. Lungs CTAB. Gastrointestinal: Soft and nontender. No distention. Musculoskeletal: No lower extremity tenderness nor edema.  Neurologic:  Normal speech and language. No gross focal neurologic deficits are appreciated.    ED Results / Procedures / Treatments   Labs (all labs ordered are listed, but only abnormal results are displayed) Labs Reviewed  CBC - Abnormal; Notable for the following components:      Result Value   WBC 11.3 (*)    All other components within normal limits  BASIC METABOLIC PANEL - Abnormal; Notable for the following components:   Sodium 131 (*)    CO2 21 (*)    Glucose, Bld 162 (*)    All other components within normal limits     PROCEDURES:  Critical Care performed: No  Procedures   MEDICATIONS ORDERED IN ED: Medications  tranexamic acid (CYKLOKAPRON) 1000 MG/10ML nebulizer solution 500 mg (500 mg Nebulization Given 01/26/23 1516)  ondansetron (ZOFRAN-ODT) disintegrating tablet 4 mg (4 mg Oral Given 01/26/23 1515)     IMPRESSION / MDM / ASSESSMENT AND PLAN / ED COURSE  I reviewed the triage vital signs and the nursing notes.                              32 y.o. female with past medical history of hypertension, diabetes, and PCOS who presents to the ED with bleeding following tonsillectomy performed 8 days ago.  Patient's presentation is most consistent with acute presentation with potential threat to life or bodily function.  Differential diagnosis includes, but is not limited to, post tonsillectomy hemorrhage, anemia, postoperative pain.  Patient nontoxic-appearing and in no acute distress, vital signs are unremarkable.  She is continuously spitting a small amount of blood but does not seem to have  any major hemorrhage at this time and is hemodynamically stable.  Labs are reassuring with no significant anemia, leukocytosis, tract abnormality, or AKI.  Case discussed with Dr. Andee Poles of ENT, who agrees with plan to proceed with nebulized TXA, states he will plan to take patient to the OR if this is not effective.  Patient with resolution of bleeding following nebulized TXA.  She was evaluated by Dr. Andee Poles, who states she is appropriate for discharge home with outpatient follow-up.  Blood pressure has been controlled here in the ED, she was counseled to return to the ED for new or worsening symptoms.  Patient agrees with plan.      FINAL CLINICAL IMPRESSION(S) / ED DIAGNOSES   Final diagnoses:  Post-tonsillectomy hemorrhage     Rx / DC Orders   ED Discharge Orders          Ordered    predniSONE (STERAPRED UNI-PAK 21 TAB) 10 MG (21) TBPK tablet        01/26/23 1640             Note:  This document was prepared using Dragon voice recognition software and may include unintentional dictation errors.   Chesley Noon, MD 01/26/23 [id number]

## 2023-01-26 NOTE — ED Notes (Signed)
See triage note  Presents with post op bleeding  States she had her tonsils removed on Wednesday   States she noticed some blood tinged sputum this am  States she developed heavier bleeding with passing some clots   Then also notice that her stool was dark

## 2023-01-26 NOTE — ED Triage Notes (Signed)
Pt via POV from home. Pt had a bilateral tonsillectomy on Wednesday. States this AM she has had severe pain and "coughing up blood". Denies any fevers. Pt is A&Ox4 and NAD.

## 2023-03-21 ENCOUNTER — Telehealth (INDEPENDENT_AMBULATORY_CARE_PROVIDER_SITE_OTHER): Payer: Medicaid Other | Admitting: Internal Medicine

## 2023-03-21 ENCOUNTER — Telehealth: Payer: Self-pay | Admitting: Internal Medicine

## 2023-03-21 ENCOUNTER — Encounter: Payer: Self-pay | Admitting: Internal Medicine

## 2023-03-21 VITALS — Ht 65.0 in

## 2023-03-21 DIAGNOSIS — E1165 Type 2 diabetes mellitus with hyperglycemia: Secondary | ICD-10-CM

## 2023-03-21 MED ORDER — GLIMEPIRIDE 1 MG PO TABS: 1.0000 mg | ORAL_TABLET | Freq: Every day | ORAL | 3 refills | Status: AC

## 2023-03-21 NOTE — Telephone Encounter (Signed)
1)  F.Y.I.-Patient is calling to say that her blood sugar level last night got up to 356 on monitor And with finger stick it was 353.  Patient states that she had trouble getting it down, but after a couple of hours she was able to get lower.  2)  Patient also wants to know if her appointment for 04/26/2023 can be changed to a virtual visit due to a change in work schedule and she cannot get off.

## 2023-03-21 NOTE — Progress Notes (Signed)
Virtual Visit via Video Note  I connected with [name] on 03/21/23 at 10:30  by a video enabled telemedicine application and verified that I am speaking with the correct person using two identifiers.   I discussed the limitations of evaluation and management by telemedicine and the availability of in person appointments. The patient expressed understanding and agreed to proceed.   -Location of the patient : Car  -Location of the provider : Office -The names of all persons participating in the telemedicine service : Pt and myself       Name: Kim Crawford  MRN/ DOB: [id number], [birth year]   Age/ Sex: 32 y.o., female    PCP: Marguarite Arbour, MD   Reason for Endocrinology Evaluation: Type 2 Diabetes Mellitus     Date of Initial Endocrinology Visit: 02/09/2022    PATIENT IDENTIFIER: [name] is a 32 y.o. female with a past medical history of T2DM, dyslipidemia, HTN. The patient presented for initial endocrinology clinic visit on 02/09/2022 for consultative assistance with her diabetes management.    HPI: Kim Crawford   Diagnosed with DM 2020 Prior Medications tried/Intolerance: Invokana -recurrent  yeast infection                Hemoglobin A1c has ranged from 6.8 % in 2021, peaking at 7.6% in 2023  On her initial visit to our clinic she had an A1c of 7.3%, her Ozempic had recently increased to 1 mg which we continued as well as continuing metformin  By 06/2022 Ozempic was discontinued due to gallbladder issues, she was involved in a class action lawsuit for Ozempic and was not able to restart   She is s/p cholecystectomy 03/2022  SUBJECTIVE:   During the last visit (06/14/2022): A1c 7.0%     Today (03/21/23): Ms. Coor is here for follow-up on diabetes management.  She checks her glucose multiple times throughout the day through freestyle libre   She did notice a BG reading of 361 Mg/DL last night after eating a bologna sandwich and drinking water,  the patient assures me that she does not snack and she did not drink any sugar sweetened beverages  She denies any nausea or vomiting She did notice transient blurry vision last night with hyperglycemia She does have IBS with alternating bowel movements    HOME DIABETES REGIMEN: Metformin 500 mg ,2 tabs daily    Statin: no ACE-I/ARB: no  CONTINUOUS GLUCOSE MONITORING RECORD INTERPRETATION    Dates of Recording: 9/25-10/01/2023  Sensor description:freetyle libre  Results statistics:   CGM use % of time 75  Average and SD 176  Time in range  58 %  % Time Above 180 37  % Time above 250 5  % Time Below target 0   Glycemic patterns summary: BG's optimal overnight and increase during the day   Hyperglycemic episodes  postprandial  Hypoglycemic episodes occurred n/a  Overnight periods: optimal   DIABETIC COMPLICATIONS: Microvascular complications:   Denies: CKD, retinopaty, neuropathy Last eye exam: Completed 2023  Macrovascular complications:   Denies: CAD, PVD, CVA   PAST HISTORY: Past Medical History:  Past Medical History:  Diagnosis Date   Breast mass, right 2014   Diabetes mellitus without complication (HCC)    Eczema    Headache    migaines   Hypertension    PCOS (polycystic ovarian syndrome)    Past Surgical History:  Past Surgical History:  Procedure Laterality Date   BREAST SURGERY Right 06/13/2012  breast mass   CESAREAN SECTION  06/13/2009   CHOLECYSTECTOMY     COLONOSCOPY WITH PROPOFOL N/A 05/29/2019   Procedure: COLONOSCOPY WITH PROPOFOL;  Surgeon: Toney Reil, MD;  Location: [facility];  Service: Gastroenterology;  Laterality: N/A;   LAPAROSCOPIC VAGINAL HYSTERECTOMY WITH SALPINGECTOMY Bilateral 04/22/2022   Procedure: LAPAROSCOPIC ASSISTED VAGINAL HYSTERECTOMY WITH SALPINGECTOMY;  Surgeon: Schermerhorn, Ihor Austin, MD;  Location: [facility] ORS;  Service: Gynecology;  Laterality: Bilateral;   LEEP N/A 09/23/2021   Procedure: LOOP  ELECTROSURGICAL EXCISION PROCEDURE (LEEP);  Surgeon: Schermerhorn, Ihor Austin, MD;  Location: [facility] OR[facility];  Service: Gynecology;  Laterality: N/A;   MYRINGOTOMY WITH TUBE PLACEMENT Right 01/18/2023   Procedure: MYRINGOTOMY WITH TUBE PLACEMENT;  Surgeon: Bud Face, MD;  Location: [facility];  Service: ENT;  Laterality: Right;   PILONIDAL CYST EXCISION N/A 02/27/2018   Procedure: CYST EXCISION PILONIDAL SIMPLE;  Surgeon: Leafy Ro, MD;  Location: [facility] ORS;  Service: General;  Laterality: N/A;   TONSILLECTOMY Bilateral 01/18/2023   Procedure: TONSILLECTOMY;  Surgeon: Bud Face, MD;  Location: [facility];  Service: ENT;  Laterality: Bilateral;    Social History:  reports that she has been smoking cigarettes. She started smoking about 18 years ago. She has a 9.4 pack-year smoking history. She has never used smokeless tobacco. She reports current alcohol use. She reports that she does not use drugs. Family History:  Family History  Adopted: Yes  Problem Relation Age of Onset   Hypertension Mother    Diabetes Mother    Parkinson's disease Father      HOME MEDICATIONS: Allergies as of 03/21/2023       Reactions   Toradol [ketorolac Tromethamine] Other (See Comments)   "Suicide headaches" says that she can take in lower doses.   Tramadol Other (See Comments)   Severe headaches   Invokana [canagliflozin] Other (See Comments)   Yeast infection    Ozempic (0.25 Or 0.5 Mg-dose) [semaglutide(0.25 Or 0.5mg -dos)] Other (See Comments)   Did not feel good and developed gallstones    Hydrocodone Other (See Comments)   Severe migraine headaches (Can take in small doses), states that she is able to now tolerate   Penicillins Rash, Other (See Comments)   Has patient had a PCN reaction causing immediate rash, facial/tongue/throat swelling, SOB or lightheadedness with hypotension: Unknown Has patient had a PCN reaction causing severe rash involving mucus membranes or skin  necrosis: No Has patient had a PCN reaction that required hospitalization: No Has patient had a PCN reaction occurring within the last 10 years: No If all of the above answers are "NO", then may proceed with Cephalosporin use.        Medication List        Accurate as of March 21, 2023 10:25 AM. If you have any questions, ask your nurse or doctor.          Accu-Chek Guide test strip Generic drug: glucose blood 1 each by Other route daily in the afternoon. Use as instructed   blood glucose meter kit and supplies Kit Dispense based on patient and insurance preference. Use up to four times daily as directed. (FOR ICD-9 250.00, 250.01).   buPROPion 150 MG 24 hr tablet Commonly known as: WELLBUTRIN XL Take 150 mg by mouth daily.   buPROPion 300 MG 24 hr tablet Commonly known as: WELLBUTRIN XL Take 300 mg by mouth daily.   ciprofloxacin-dexamethasone OTIC suspension Commonly known as: CIPRODEX Place 4 drops into the right ear 2 (  two) times daily for 5 days. (DOS 01/18/23)   FreeStyle Libre 3 Sensor Misc 1 Device by Does not apply route as directed. Place 1 sensor on the skin every 14 days. Use to check glucose continuously   gemfibrozil 600 MG tablet Commonly known as: LOPID Take 600 mg by mouth in the morning and at bedtime.   lidocaine 2 % solution Commonly known as: XYLOCAINE Use as directed 10 mLs in the mouth or throat every 6 (six) hours as needed for mouth pain (Swish and spit).   metFORMIN 500 MG 24 hr tablet Commonly known as: GLUCOPHAGE-XR Take 1 tablet (500 mg total) by mouth 2 (two) times daily with a meal.   ondansetron 4 MG tablet Commonly known as: Zofran Take 1 tablet (4 mg total) by mouth every 8 (eight) hours as needed for nausea or vomiting.   oxyCODONE 5 MG/5ML solution Commonly known as: ROXICODONE Take 5 mLs (5 mg total) by mouth every 4 (four) hours as needed for severe pain.   predniSONE 10 MG (21) Tbpk tablet Commonly known as: STERAPRED  UNI-PAK 21 TAB Sterapred DS 6 day taper   propranolol 20 MG tablet Commonly known as: INDERAL Take 20 mg by mouth 2 (two) times daily.         ALLERGIES: Allergies  Allergen Reactions   Toradol [Ketorolac Tromethamine] Other (See Comments)    "Suicide headaches" says that she can take in lower doses.   Tramadol Other (See Comments)    Severe headaches   Invokana [Canagliflozin] Other (See Comments)    Yeast infection    Ozempic (0.25 Or 0.5 Mg-Dose) [Semaglutide(0.25 Or 0.5mg -Dos)] Other (See Comments)    Did not feel good and developed gallstones    Hydrocodone Other (See Comments)    Severe migraine headaches (Can take in small doses), states that she is able to now tolerate   Penicillins Rash and Other (See Comments)    Has patient had a PCN reaction causing immediate rash, facial/tongue/throat swelling, SOB or lightheadedness with hypotension: Unknown Has patient had a PCN reaction causing severe rash involving mucus membranes or skin necrosis: No Has patient had a PCN reaction that required hospitalization: No Has patient had a PCN reaction occurring within the last 10 years: No If all of the above answers are "NO", then may proceed with Cephalosporin use.      REVIEW OF SYSTEMS: A comprehensive ROS was conducted with the patient and is negative except as per HPI and below:      OBJECTIVE:   VITAL SIGNS: Ht 5\' 5"  (1.651 m)   LMP  (LMP Unknown)   BMI 30.79 kg/m    PHYSICAL EXAM:  General: Pt appears well and is in NAD  Neuro: MS is good with appropriate affect, pt is alert and Ox3    DM foot exam: 10/24/2022  The skin of the feet is intact without sores or ulcerations. The pedal pulses are 2+ on right and 2+ on left. The sensation is intact to a screening 5.07, 10 gram monofilament bilaterally    DATA REVIEWED:  Lab Results  Component Value Date   HGBA1C 7.0 (A) 06/14/2022   HGBA1C 5.9 (H) 03/23/2022   HGBA1C 7.5 (H) 05/27/2019   Lab Results   Component Value Date   CREATININE 0.58 01/26/2023   08/30/2022 BUN/Cr 10/0.6 GFR 121 Tg 156 LDL 91 HDL 36.1 TSH 0.973 Z6X 7.2%    ASSESSMENT / PLAN / RECOMMENDATIONS:   1) Type 2 Diabetes Mellitus, Sub- optimally controlled,  Without complications - Most recent A1c of 7.2 %. Goal A1c < 7.0 %.    - A1c remains To be slightly above goal - She stopped Ozempic due to gallbladder issues,  she is s/p cholecystectomy in 2023, does not wish to go back on it at this time - She developed 2 yeast  infections while on Invokana -I have recommended starting glimepiride, patient advised to take it 15-20 minutes before the first meal of the day   MEDICATIONS: Continue metformin 500 mg XR, 2 tabs daily Start glimepiride 1 mg daily   EDUCATION / INSTRUCTIONS: BG monitoring instructions: Patient is instructed to check her blood sugars 3 times a day, before meals .   2) Diabetic complications:  Eye: Does not have known diabetic retinopathy.  Neuro/ Feet: Does not have known diabetic peripheral neuropathy. Renal: Patient does not have known baseline CKD. She is not on an ACEI/ARB at present.  3) Hypertriglyceridemia    - She had an elevated Tg as high as 839 mg/dL in 08/2438 , that's when she was started on Gemfibrozil  - Repeat Tg and LDL normalized     Signed electronically by: Lyndle Herrlich, MD  [facility]  [facility] [address] [place], [place] [address] Phone: [phone] FAX: [phone]   CC: Marguarite Arbour, MD [address] [facility] [place] [address] Phone: [phone]  Fax: [phone]    Return to Endocrinology clinic as below: Future Appointments  Date Time Provider Department Center  03/21/2023 10:30 AM Okie Bogacz, Konrad Dolores, MD [facility] None  04/26/2023  3:00 PM Jocelynne Duquette, Konrad Dolores, MD [facility] None

## 2023-03-21 NOTE — Telephone Encounter (Signed)
Kim Crawford report has been printed. Patient has been taking Metformin 1 tab BID

## 2023-04-26 ENCOUNTER — Ambulatory Visit: Payer: Medicaid Other | Admitting: Internal Medicine

## 2023-07-27 ENCOUNTER — Ambulatory Visit: Payer: Medicaid Other | Admitting: Internal Medicine

## 2023-07-27 NOTE — Progress Notes (Deleted)
 Name: Kim Crawford  MRN/ DOB: [id number], [birth year]   Age/ Sex: 33 y.o., female    PCP: Marguarite Arbour, MD   Reason for Endocrinology Evaluation: Type 2 Diabetes Mellitus     Date of Initial Endocrinology Visit: 02/09/2022    PATIENT IDENTIFIER: [name] is a 33 y.o. female with a past medical history of T2DM, dyslipidemia, HTN. The patient presented for initial endocrinology clinic visit on 02/09/2022 for consultative assistance with her diabetes management.    HPI: [name]   Diagnosed with DM 2020 Prior Medications tried/Intolerance: Invokana -recurrent  yeast infection                Hemoglobin A1c has ranged from 6.8 % in 2021, peaking at 7.6% in 2023  On her initial visit to our clinic she had an A1c of 7.3%, her Ozempic had recently increased to 1 mg which we continued as well as continuing metformin  By 06/2022 Ozempic was discontinued due to gallbladder issues, she was involved in a class action lawsuit for Ozempic and was not able to restart   She is s/p cholecystectomy 03/2022  Started glimepiride 03/2023  SUBJECTIVE:   During the last visit (03/21/2023): This was a virtual visit    Today (07/27/23): [name] is here for follow-up on diabetes management. She endorses hypoglycemia at night , she is symptomatic with these episodes.    Has chronic intermittent right arm and leg numbness, denies cervical issues  Denies nausea, but has constipation   HOME DIABETES REGIMEN: Metformin 500 mg ,2 tabs daily  Glimepiride 1 mg daily  Statin: no ACE-I/ARB: no  CONTINUOUS GLUCOSE MONITORING RECORD INTERPRETATION    Dates of Recording: 4/23-10/17/2022   Sensor description:freetyle libre  Results statistics:   CGM use % of time 99  Average and SD 158/26.7  Time in range     72   %  % Time Above 180 23  % Time above 250 4  % Time Below target 1   Glycemic patterns summary: BG's optimal overnight and increase during the day   Hyperglycemic  episodes  postprandial  Hypoglycemic episodes occurred n/a  Overnight periods: optimal   DIABETIC COMPLICATIONS: Microvascular complications:   Denies: CKD, retinopaty, neuropathy Last eye exam: Completed 2023  Macrovascular complications:   Denies: CAD, PVD, CVA   PAST HISTORY: Past Medical History:  Past Medical History:  Diagnosis Date   Breast mass, right 2014   Diabetes mellitus without complication (HCC)    Eczema    Headache    migaines   Hypertension    PCOS (polycystic ovarian syndrome)    Past Surgical History:  Past Surgical History:  Procedure Laterality Date   BREAST SURGERY Right 06/13/2012   breast mass   CESAREAN SECTION  06/13/2009   CHOLECYSTECTOMY     COLONOSCOPY WITH PROPOFOL N/A 05/29/2019   Procedure: COLONOSCOPY WITH PROPOFOL;  Surgeon: Toney Reil, MD;  Location: [facility];  Service: Gastroenterology;  Laterality: N/A;   LAPAROSCOPIC VAGINAL HYSTERECTOMY WITH SALPINGECTOMY Bilateral 04/22/2022   Procedure: LAPAROSCOPIC ASSISTED VAGINAL HYSTERECTOMY WITH SALPINGECTOMY;  Surgeon: Schermerhorn, Ihor Austin, MD;  Location: [facility];  Service: Gynecology;  Laterality: Bilateral;   LEEP N/A 09/23/2021   Procedure: LOOP ELECTROSURGICAL EXCISION PROCEDURE (LEEP);  Surgeon: Schermerhorn, Ihor Austin, MD;  Location: [facility];  Service: Gynecology;  Laterality: N/A;   MYRINGOTOMY WITH TUBE PLACEMENT Right 01/18/2023   Procedure: MYRINGOTOMY WITH TUBE PLACEMENT;  Surgeon: Bud Face, MD;  Location: [facility];  Service: ENT;  Laterality: Right;   PILONIDAL CYST EXCISION N/A 02/27/2018   Procedure: CYST EXCISION PILONIDAL SIMPLE;  Surgeon: Leafy Ro, MD;  Location: [facility];  Service: General;  Laterality: N/A;   TONSILLECTOMY Bilateral 01/18/2023   Procedure: TONSILLECTOMY;  Surgeon: Bud Face, MD;  Location: [facility];  Service: ENT;  Laterality: Bilateral;    Social History:  reports that she has been smoking  cigarettes. She started smoking about 19 years ago. She has a 9.6 pack-year smoking history. She has never used smokeless tobacco. She reports current alcohol use. She reports that she does not use drugs. Family History:  Family History  Adopted: Yes  Problem Relation Age of Onset   Hypertension Mother    Diabetes Mother    Parkinson's disease Father      HOME MEDICATIONS: Allergies as of 07/27/2023       Reactions   Toradol [ketorolac Tromethamine] Other (See Comments)   "Suicide headaches" says that she can take in lower doses.   Tramadol Other (See Comments)   Severe headaches   Invokana [canagliflozin] Other (See Comments)   Yeast infection    Ozempic (0.25 Or 0.5 Mg-dose) [semaglutide(0.25 Or 0.5mg -dos)] Other (See Comments)   Did not feel good and developed gallstones    Hydrocodone Other (See Comments)   Severe migraine headaches (Can take in small doses), states that she is able to now tolerate   Penicillins Rash, Other (See Comments)   Has patient had a PCN reaction causing immediate rash, facial/tongue/throat swelling, SOB or lightheadedness with hypotension: Unknown Has patient had a PCN reaction causing severe rash involving mucus membranes or skin necrosis: No Has patient had a PCN reaction that required hospitalization: No Has patient had a PCN reaction occurring within the last 10 years: No If all of the above answers are "NO", then may proceed with Cephalosporin use.        Medication List        Accurate as of July 27, 2023  7:03 AM. If you have any questions, ask your nurse or doctor.          Accu-Chek Guide test strip Generic drug: glucose blood 1 each by Other route daily in the afternoon. Use as instructed   blood glucose meter kit and supplies Kit Dispense based on patient and insurance preference. Use up to four times daily as directed. (FOR ICD-9 250.00, 250.01).   buPROPion 150 MG 24 hr tablet Commonly known as: WELLBUTRIN XL Take 150  mg by mouth daily.   buPROPion 300 MG 24 hr tablet Commonly known as: WELLBUTRIN XL Take 300 mg by mouth daily.   ciprofloxacin-dexamethasone OTIC suspension Commonly known as: CIPRODEX Place 4 drops into the right ear 2 (two) times daily for 5 days. (DOS 01/18/23)   FreeStyle Libre 3 Sensor Misc 1 Device by Does not apply route as directed. Place 1 sensor on the skin every 14 days. Use to check glucose continuously   gemfibrozil 600 MG tablet Commonly known as: LOPID Take 600 mg by mouth in the morning and at bedtime.   glimepiride 1 MG tablet Commonly known as: AMARYL Take 1 tablet (1 mg total) by mouth daily with breakfast.   lidocaine 2 % solution Commonly known as: XYLOCAINE Use as directed 10 mLs in the mouth or throat every 6 (six) hours as needed for mouth pain (Swish and spit).   metFORMIN 500 MG 24 hr tablet Commonly known as: GLUCOPHAGE-XR Take 1 tablet (500  mg total) by mouth 2 (two) times daily with a meal.   ondansetron 4 MG tablet Commonly known as: Zofran Take 1 tablet (4 mg total) by mouth every 8 (eight) hours as needed for nausea or vomiting.   oxyCODONE 5 MG/5ML solution Commonly known as: ROXICODONE Take 5 mLs (5 mg total) by mouth every 4 (four) hours as needed for severe pain.   predniSONE 10 MG (21) Tbpk tablet Commonly known as: STERAPRED UNI-PAK 21 TAB Sterapred DS 6 day taper   propranolol 20 MG tablet Commonly known as: INDERAL Take 20 mg by mouth 2 (two) times daily.         ALLERGIES: Allergies  Allergen Reactions   Toradol [Ketorolac Tromethamine] Other (See Comments)    "Suicide headaches" says that she can take in lower doses.   Tramadol Other (See Comments)    Severe headaches   Invokana [Canagliflozin] Other (See Comments)    Yeast infection    Ozempic (0.25 Or 0.5 Mg-Dose) [Semaglutide(0.25 Or 0.5mg -Dos)] Other (See Comments)    Did not feel good and developed gallstones    Hydrocodone Other (See Comments)    Severe  migraine headaches (Can take in small doses), states that she is able to now tolerate   Penicillins Rash and Other (See Comments)    Has patient had a PCN reaction causing immediate rash, facial/tongue/throat swelling, SOB or lightheadedness with hypotension: Unknown Has patient had a PCN reaction causing severe rash involving mucus membranes or skin necrosis: No Has patient had a PCN reaction that required hospitalization: No Has patient had a PCN reaction occurring within the last 10 years: No If all of the above answers are "NO", then may proceed with Cephalosporin use.      REVIEW OF SYSTEMS: A comprehensive ROS was conducted with the patient and is negative except as per HPI and below:      OBJECTIVE:   VITAL SIGNS: LMP  (LMP Unknown)    PHYSICAL EXAM:  General: Pt appears well and is in NAD  Lungs: Clear with good BS bilat   Heart: RRR   Abdomen: soft, nontender  Extremities:  Lower extremities - No pretibial edema.  Neuro: MS is good with appropriate affect, pt is alert and Ox3    DM foot exam: 10/24/2022  The skin of the feet is intact without sores or ulcerations. The pedal pulses are 2+ on right and 2+ on left. The sensation is intact to a screening 5.07, 10 gram monofilament bilaterally    DATA REVIEWED:  Lab Results  Component Value Date   HGBA1C 7.0 (A) 06/14/2022   HGBA1C 5.9 (H) 03/23/2022   HGBA1C 7.5 (H) 05/27/2019    Latest Reference Range & Units 01/26/23 12:55  Sodium 135 - 145 mmol/L 131 (L)  Potassium 3.5 - 5.1 mmol/L 4.2  Chloride 98 - 111 mmol/L 101  CO2 22 - 32 mmol/L 21 (L)  Glucose 70 - 99 mg/dL 098 (H)  BUN 6 - 20 mg/dL 18  Creatinine 1.19 - 1.47 mg/dL 8.29  Calcium 8.9 - 56.2 mg/dL 9.0  Anion gap 5 - 15  9  GFR, Estimated >60 mL/min >60     ASSESSMENT / PLAN / RECOMMENDATIONS:   1) Type 2 Diabetes Mellitus, Sub- optimally controlled, Without complications - Most recent A1c of 7.2 %. Goal A1c < 7.0 %.    - A1c remains To be  slightly above goal - She stopped Ozempic due to gallbladder issues,  she is s/p cholecystectomy in 2023 -  She developed 2 yeast  infections while on Invokana - she had declined increasing metformin in the past and opted for lifestyle changes -She has been noted with hypoglycemia at night, patient tells me she attempted to confirm this through her glucose meter, and there was no eating.  I again discussed with the patient that the studies did not show any evidence of hypoglycemia on metformin, and I suspect this is more of an error -She was given a new Accu-Chek guide meter with extra strips -No changes at this time    MEDICATIONS: Continue metformin 500 mg XR, 2 tabs daily   EDUCATION / INSTRUCTIONS: BG monitoring instructions: Patient is instructed to check her blood sugars 3 times a day, before meals .   2) Diabetic complications:  Eye: Does not have known diabetic retinopathy.  Neuro/ Feet: Does not have known diabetic peripheral neuropathy. Renal: Patient does not have known baseline CKD. She is not on an ACEI/ARB at present.  3) Hypertriglyceridemia    - She had an elevated Tg as high as 839 mg/dL in 06/6107 , that's when she was started on Gemfibrozil  - Repeat Tg and LDL normalized     F/U in 6 months    Signed electronically by: Lyndle Herrlich, MD  [facility]  [facility] [address] [place] [place], [place] [address] Phone: [phone] FAX: [phone]   CC: Marguarite Arbour, MD [address] [facility] [place] [address] Phone: [phone]  Fax: [phone]    Return to Endocrinology clinic as below: Future Appointments  Date Time Provider Department Center  07/27/2023  8:50 AM Dorette Hartel, Konrad Dolores, MD [id number] None

## 2023-11-22 ENCOUNTER — Other Ambulatory Visit: Payer: Self-pay | Admitting: Otolaryngology

## 2023-12-04 NOTE — Discharge Instructions (Signed)
 [facility] DISCHARGE INSTRUCTIONS FOR MYRINGOTOMY AND TUBE INSERTION   [facility] Bud Face, M.D.   Diet:   After surgery, the patient should take only liquids and foods as tolerated.  The patient may then have a regular diet after the effects of anesthesia have worn off, usually about four to six hours after surgery.  Activities:   The patient should rest until the effects of anesthesia have worn off.  After this, there are no restrictions on the normal daily activities.  Medications:   You will be given a prescription for antibiotic drops to be used in the ears postoperatively.  It is recommended to use 4 drops 2 times a day for 4 days, then the drops should be saved for possible future use.  The tubes should not cause any discomfort to the patient, but if there is any question, Tylenol should be given according to the instructions for the age of the patient.  Other medications should be continued normally.  Precautions:   Should there be recurrent drainage after the tubes are placed, the drops should be used for approximately 3-4 days.  If it does not clear, you should call the ENT office.  Earplugs:   Earplugs are only needed for those who are going to be submerged under water.  When taking a bath or shower and using a cup or showerhead to rinse hair, it is not necessary to wear earplugs.  These come in a variety of fashions, all of which can be obtained at our office.  However, if one is not able to come by the office, then silicone plugs can be found at most pharmacies.  It is not advised to stick anything in the ear that is not approved as an earplug.  Silly putty is not to be used as an earplug.  Swimming is allowed in patients after ear tubes are inserted, however, they must wear earplugs if they are going to be submerged under water.  For those children who are going to be swimming a lot, it is recommended to use a fitted ear mold, which can be  made by our audiologist.  If discharge is noticed from the ears, this most likely represents an ear infection.  We would recommend getting your eardrops and using them as indicated above.  If it does not clear, then you should call the ENT office.  For follow up, the patient should return to the ENT office three weeks postoperatively and then every six months as required by the doctor.

## 2023-12-06 ENCOUNTER — Encounter: Payer: Self-pay | Admitting: Otolaryngology

## 2023-12-07 ENCOUNTER — Other Ambulatory Visit: Payer: Self-pay

## 2023-12-07 MED ORDER — CIPROFLOXACIN-DEXAMETHASONE 0.3-0.1 % OT SUSP
4.0000 [drp] | Freq: Two times a day (BID) | OTIC | 0 refills | Status: AC
Start: 2023-11-21 — End: ?
  Filled 2023-12-07: qty 7.5, 7d supply, fill #0

## 2023-12-12 NOTE — Anesthesia Preprocedure Evaluation (Signed)
 Anesthesia Evaluation  Patient identified by MRN, date of birth, ID band Patient awake    Reviewed: Allergy & Precautions, H&P , NPO status , Patient's Chart, lab work & pertinent test results, reviewed documented beta blocker date and time   Airway Mallampati: II   Neck ROM: full    Dental  (+) Poor Dentition   Pulmonary neg pulmonary ROS, Current Smoker and Patient abstained from smoking.   Pulmonary exam normal        Cardiovascular Exercise Tolerance: Good hypertension, On Medications negative cardio ROS Normal cardiovascular exam Rhythm:regular Rate:Normal     Neuro/Psych  Headaches  negative psych ROS   GI/Hepatic negative GI ROS, Neg liver ROS,,,  Endo/Other  negative endocrine ROSdiabetes, Well Controlled, Type 2, Oral Hypoglycemic Agents    Renal/GU negative Renal ROS  negative genitourinary   Musculoskeletal   Abdominal   Peds  Hematology negative hematology ROS (+)   Anesthesia Other Findings   Eczema  Breast mass, right PCOS (polycystic ovarian syndrome) Diabetes mellitus without complication  Hypertension  Headache    Reproductive/Obstetrics negative OB ROS                              Anesthesia Physical Anesthesia Plan  ASA: 2  Anesthesia Plan: General   Post-op Pain Management:    Induction:   PONV Risk Score and Plan:   Airway Management Planned:   Additional Equipment:   Intra-op Plan:   Post-operative Plan:   Informed Consent: I have reviewed the patients History and Physical, chart, labs and discussed the procedure including the risks, benefits and alternatives for the proposed anesthesia with the patient or authorized representative who has indicated his/her understanding and acceptance.     Dental Advisory Given  Plan Discussed with: CRNA  Anesthesia Plan Comments:         Anesthesia Quick Evaluation

## 2023-12-13 ENCOUNTER — Encounter: Payer: Self-pay | Admitting: Otolaryngology

## 2023-12-13 ENCOUNTER — Ambulatory Visit: Payer: Self-pay | Admitting: Anesthesiology

## 2023-12-13 ENCOUNTER — Ambulatory Visit
Admission: RE | Admit: 2023-12-13 | Discharge: 2023-12-13 | Disposition: A | Discharge status: Home / Self Care | Attending: Otolaryngology | Admitting: Otolaryngology

## 2023-12-13 ENCOUNTER — Encounter
Admission: RE | Disposition: A | Discharge status: Home / Self Care | Payer: Self-pay | Source: Home / Self Care | Attending: Otolaryngology

## 2023-12-13 ENCOUNTER — Other Ambulatory Visit: Payer: Self-pay

## 2023-12-13 DIAGNOSIS — H6981 Other specified disorders of Eustachian tube, right ear: Secondary | ICD-10-CM | POA: Insufficient documentation

## 2023-12-13 HISTORY — PX: MYRINGOTOMY WITH TUBE PLACEMENT: SHX5663

## 2023-12-13 LAB — GLUCOSE, CAPILLARY: Glucose-Capillary: 212 mg/dL — ABNORMAL HIGH (ref 70–99)

## 2023-12-13 SURGERY — MYRINGOTOMY WITH TUBE PLACEMENT
Anesthesia: General | Site: Ear | Laterality: Right

## 2023-12-13 MED ORDER — FENTANYL CITRATE PF 50 MCG/ML IJ SOSY: 25.0000 ug | PREFILLED_SYRINGE | INTRAMUSCULAR | Status: DC | PRN

## 2023-12-13 MED ORDER — MIDAZOLAM HCL 5 MG/5ML IJ SOLN
INTRAMUSCULAR | Status: DC | PRN
  Administered 2023-12-13: 2 mg via INTRAVENOUS

## 2023-12-13 MED ORDER — OXYCODONE HCL 5 MG/5ML PO SOLN: 5.0000 mg | Freq: Once | ORAL | Status: DC | PRN

## 2023-12-13 MED ORDER — CIPROFLOXACIN-DEXAMETHASONE 0.3-0.1 % OT SUSP: 4.0000 [drp] | Freq: Two times a day (BID) | OTIC | Status: DC

## 2023-12-13 MED ORDER — PROPOFOL 500 MG/50ML IV EMUL
INTRAVENOUS | Status: DC | PRN
  Administered 2023-12-13: 80 mg via INTRAVENOUS
  Administered 2023-12-13 (×2): 40 mg via INTRAVENOUS
  Administered 2023-12-13: 20 mg via INTRAVENOUS

## 2023-12-13 MED ORDER — FENTANYL CITRATE (PF) 100 MCG/2ML IJ SOLN
INTRAMUSCULAR | Status: DC | PRN
  Administered 2023-12-13: 50 ug via INTRAVENOUS

## 2023-12-13 MED ORDER — ACETAMINOPHEN 10 MG/ML IV SOLN: 1000.0000 mg | Freq: Once | INTRAVENOUS | Status: DC | PRN

## 2023-12-13 MED ORDER — ONDANSETRON HCL 4 MG PO TABS: 4.0000 mg | ORAL_TABLET | Freq: Three times a day (TID) | ORAL | 0 refills | Status: AC | PRN

## 2023-12-13 MED ORDER — LACTATED RINGERS IV SOLN
INTRAVENOUS | Status: DC
Start: 2023-12-13 — End: 2023-12-13

## 2023-12-13 MED ORDER — DROPERIDOL 2.5 MG/ML IJ SOLN: 0.6250 mg | Freq: Once | INTRAMUSCULAR | Status: DC | PRN

## 2023-12-13 MED ORDER — CIPROFLOXACIN-DEXAMETHASONE 0.3-0.1 % OT SUSP
OTIC | Status: DC | PRN
  Administered 2023-12-13: 2 [drp]

## 2023-12-13 MED ORDER — FENTANYL CITRATE (PF) 100 MCG/2ML IJ SOLN
INTRAMUSCULAR | Status: AC
  Filled 2023-12-13: qty 2

## 2023-12-13 MED ORDER — LIDOCAINE HCL (CARDIAC) PF 100 MG/5ML IV SOSY
PREFILLED_SYRINGE | INTRAVENOUS | Status: DC | PRN
  Administered 2023-12-13: 40 mg via INTRAVENOUS

## 2023-12-13 MED ORDER — OXYCODONE HCL 5 MG PO TABS: 5.0000 mg | ORAL_TABLET | Freq: Once | ORAL | Status: DC | PRN

## 2023-12-13 MED ORDER — PROPOFOL 10 MG/ML IV BOLUS
INTRAVENOUS | Status: AC
  Filled 2023-12-13: qty 40

## 2023-12-13 MED ORDER — MIDAZOLAM HCL 2 MG/2ML IJ SOLN
INTRAMUSCULAR | Status: AC
  Filled 2023-12-13: qty 2

## 2023-12-13 MED ORDER — SODIUM CHLORIDE 0.9 % IV SOLN: INTRAVENOUS | Status: DC

## 2023-12-13 MED ORDER — LACTATED RINGERS IV SOLN: INTRAVENOUS | Status: DC

## 2023-12-13 SURGICAL SUPPLY — 8 items
BLADE MYR LANCE NRW W/HDL (BLADE) ×2 IMPLANT
CANISTER SUCT 1200ML W/VALVE (MISCELLANEOUS) ×2 IMPLANT
COTTONBALL LRG STERILE PKG (GAUZE/BANDAGES/DRESSINGS) ×2 IMPLANT
GLOVE SURG GAMMEX PI TX LF 7.5 (GLOVE) ×2 IMPLANT
STRAP BODY AND KNEE 60X3 (MISCELLANEOUS) ×2 IMPLANT
TOWEL OR 17X26 4PK STRL BLUE (TOWEL DISPOSABLE) ×2 IMPLANT
TUBE EAR T 1.27X5.3 BFLY (OTOLOGIC RELATED) IMPLANT
TUBING SUCTION CONN 0.25 STRL (TUBING) ×2 IMPLANT

## 2023-12-13 NOTE — Anesthesia Postprocedure Evaluation (Signed)
 Anesthesia Post Note  Patient: Hoy DELENA Dage  Procedure(s) Performed: MYRINGOTOMY WITH TUBE PLACEMENT (Right: Ear) EXAM UNDER GENERAL ANESTHESIA, OTOLARYNGOLOGIC (Left)  Patient location during evaluation: PACU Anesthesia Type: General Level of consciousness: awake and alert Pain management: pain level controlled Vital Signs Assessment: post-procedure vital signs reviewed and stable Respiratory status: spontaneous breathing, nonlabored ventilation, respiratory function stable and patient connected to nasal cannula oxygen Cardiovascular status: blood pressure returned to baseline and stable Postop Assessment: no apparent nausea or vomiting Anesthetic complications: no   No notable events documented.   Last Vitals:  Vitals:   12/13/23 0800 12/13/23 0807  BP: 114/77 108/79  Pulse: 95 87  Resp: 20 19  Temp:  (!) 36.1 C  SpO2: 94% 94%    Last Pain:  Vitals:   12/13/23 0807  TempSrc:   PainSc: 3                  Lynwood KANDICE Clause

## 2023-12-13 NOTE — Addendum Note (Signed)
 Addendum  created 12/13/23 1251 by Anice Melnick, CRNA   Flowsheet accepted

## 2023-12-13 NOTE — H&P (Signed)
..  History and Physical paper copy reviewed and updated date of procedure and will be scanned into system.  Patient seen and examined and marked.  Patient reports left ear stopped up and added possible left myringotomy and tube placement.

## 2023-12-13 NOTE — Op Note (Signed)
..  12/13/2023  7:40 AM    Abron Mays  [id number]   Pre-Op Dx:  Other specified disorders of eustachian tube, right ear [H69.81]  Post-op Dx: Other specified disorders of eustachian tube, right ear [H69.81]  Proc:   1)  Right Myringotomy with BUTTERFLY tube placement  2)  Examination under anesthesia left ear with cerumen removal  Surg: Josslyn Ciolek  Anes:  General by mask  EBL:  None  Comp:  None  Findings:  Retracted right tympanic membrane and extruded tube with granulation tissue.  BUTTERFLY tube placed anterior inferiorly.  Procedure: With the patient in a comfortable supine position, general mask anesthesia was administered.  At an appropriate level, microscope and speculum were used to examine and clean the RIGHT ear canal.  The findings were as described above.  An anterior inferior radial myringotomy incision was sharply executed.  Middle ear contents were suctioned clear with a size 5 otologic suction.  A PE tube was placed without difficulty using a Rosen pick and Facilities manager.  Ciprodex  otic solution was instilled into the external canal, and insufflated into the middle ear.  A cotton ball was placed at the external meatus. Hemostasis was observed.  This side was completed.  After completing the RIGHT side, the LEFT side was evaluated under the operating microscope.  Cerumen was removed with curette.  This revealed a normal appearing tympanic membrane.  Therefore no myringotomy with tube was placed.  Following this  The patient was returned to anesthesia, awakened, and transferred to recovery in stable condition.  Dispo:  PACU to home  Plan: Routine drop use and water precautions.  Recheck my office three weeks.   Beverly Suriano 7:40 AM 12/13/2023

## 2023-12-13 NOTE — Transfer of Care (Signed)
 Immediate Anesthesia Transfer of Care Note  Patient: Kim Crawford  Procedure(s) Performed: MYRINGOTOMY WITH TUBE PLACEMENT (Right: Ear) EXAM UNDER GENERAL ANESTHESIA, OTOLARYNGOLOGIC (Left)  Patient Location: PACU  Anesthesia Type: General  Level of Consciousness: awake, alert  and patient cooperative  Airway and Oxygen Therapy: Patient Spontanous Breathing and Patient connected to supplemental oxygen  Post-op Assessment: Post-op Vital signs reviewed, Patient's Cardiovascular Status Stable, Respiratory Function Stable, Patent Airway and No signs of Nausea or vomiting  Post-op Vital Signs: Reviewed and stable  Complications: No notable events documented.

## 2024-01-02 ENCOUNTER — Other Ambulatory Visit: Payer: Self-pay | Admitting: Internal Medicine

## 2024-01-03 ENCOUNTER — Ambulatory Visit: Admitting: Internal Medicine

## 2024-04-25 ENCOUNTER — Other Ambulatory Visit: Payer: Self-pay | Admitting: Internal Medicine

## 2024-04-25 DIAGNOSIS — I10 Essential (primary) hypertension: Secondary | ICD-10-CM

## 2024-04-25 DIAGNOSIS — R911 Solitary pulmonary nodule: Secondary | ICD-10-CM

## 2024-05-01 ENCOUNTER — Ambulatory Visit
Admission: RE | Admit: 2024-05-01 | Discharge: 2024-05-01 | Disposition: A | Discharge status: Home / Self Care | Source: Ambulatory Visit | Attending: Internal Medicine | Admitting: Internal Medicine

## 2024-05-01 ENCOUNTER — Ambulatory Visit

## 2024-05-01 DIAGNOSIS — R911 Solitary pulmonary nodule: Secondary | ICD-10-CM | POA: Insufficient documentation

## 2024-05-01 DIAGNOSIS — I10 Essential (primary) hypertension: Secondary | ICD-10-CM | POA: Insufficient documentation
